# Patient Record
Sex: Male | Born: 1995 | Race: White | Hispanic: No | Marital: Single | Smoking: Never smoker
Health system: Southern US, Community
[De-identification: ages and names within clinical notes are randomized; demographics above are authoritative.]

## PROBLEM LIST (undated history)

## (undated) ENCOUNTER — Emergency Department (HOSPITAL_COMMUNITY): Admission: EM | Discharge status: Absent without leave | Payer: Commercial Managed Care - PPO

## (undated) DIAGNOSIS — F909 Attention-deficit hyperactivity disorder, unspecified type: Secondary | ICD-10-CM

## (undated) DIAGNOSIS — F419 Anxiety disorder, unspecified: Secondary | ICD-10-CM

## (undated) DIAGNOSIS — H539 Unspecified visual disturbance: Secondary | ICD-10-CM

## (undated) HISTORY — PX: NO PAST SURGERIES: SHX2092

---
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
## 2014-02-18 ENCOUNTER — Ambulatory Visit
Admission: RE | Admit: 2014-02-18 | Discharge: 2014-02-18 | Disposition: A | Discharge status: Home / Self Care | Payer: 59 | Source: Ambulatory Visit | Attending: Pediatrics | Admitting: Pediatrics

## 2014-02-18 ENCOUNTER — Other Ambulatory Visit: Payer: Self-pay | Admitting: Pediatrics

## 2014-02-18 DIAGNOSIS — R52 Pain, unspecified: Secondary | ICD-10-CM

## 2014-02-18 DIAGNOSIS — W19XXXA Unspecified fall, initial encounter: Secondary | ICD-10-CM

## 2014-12-02 ENCOUNTER — Emergency Department (HOSPITAL_COMMUNITY)
Admission: EM | Admit: 2014-12-02 | Discharge: 2014-12-02 | Disposition: A | Discharge status: Home / Self Care | Payer: Commercial Managed Care - PPO | Attending: Emergency Medicine | Admitting: Emergency Medicine

## 2014-12-02 ENCOUNTER — Encounter (HOSPITAL_COMMUNITY): Payer: Self-pay | Admitting: Emergency Medicine

## 2014-12-02 DIAGNOSIS — R000 Tachycardia, unspecified: Secondary | ICD-10-CM | POA: Insufficient documentation

## 2014-12-02 DIAGNOSIS — J028 Acute pharyngitis due to other specified organisms: Secondary | ICD-10-CM

## 2014-12-02 DIAGNOSIS — B9689 Other specified bacterial agents as the cause of diseases classified elsewhere: Secondary | ICD-10-CM

## 2014-12-02 DIAGNOSIS — J029 Acute pharyngitis, unspecified: Secondary | ICD-10-CM | POA: Diagnosis not present

## 2014-12-02 LAB — RAPID STREP SCREEN (MED CTR MEBANE ONLY): STREPTOCOCCUS, GROUP A SCREEN (DIRECT): NEGATIVE

## 2014-12-02 MED ORDER — SODIUM CHLORIDE 0.9 % IV BOLUS (SEPSIS)
1000.0000 mL | INTRAVENOUS | Status: AC
  Administered 2014-12-02: 1000 mL via INTRAVENOUS

## 2014-12-02 MED ORDER — OXYCODONE HCL 5 MG/5ML PO SOLN
5.0000 mg | Freq: Once | ORAL | Status: AC
  Administered 2014-12-02: 5 mg via ORAL
  Filled 2014-12-02: qty 5

## 2014-12-02 MED ORDER — PENICILLIN G BENZATHINE 1200000 UNIT/2ML IM SUSP
1.2000 10*6.[IU] | Freq: Once | INTRAMUSCULAR | Status: AC
  Administered 2014-12-02: 1.2 10*6.[IU] via INTRAMUSCULAR

## 2014-12-02 MED ORDER — OXYCODONE HCL 5 MG/5ML PO SOLN: 5.0000 mg | ORAL | Status: DC | PRN

## 2014-12-02 MED ORDER — DEXAMETHASONE SODIUM PHOSPHATE 10 MG/ML IJ SOLN
10.0000 mg | Freq: Once | INTRAMUSCULAR | Status: AC
  Administered 2014-12-02: 10 mg via INTRAVENOUS
  Filled 2014-12-02: qty 1

## 2014-12-02 MED ORDER — PENICILLIN G BENZATHINE 1200000 UNIT/2ML IM SUSP
2.4000 10*6.[IU] | Freq: Once | INTRAMUSCULAR | Status: DC
  Filled 2014-12-02: qty 4

## 2014-12-02 MED ORDER — SODIUM CHLORIDE 0.9 % IV BOLUS (SEPSIS)
1000.0000 mL | Freq: Once | INTRAVENOUS | Status: AC
  Administered 2014-12-02: 1000 mL via INTRAVENOUS

## 2014-12-02 NOTE — ED Notes (Addendum)
PT reports seeing white spots on his throat 2 days ago. Pain with swallowing; able to swallow. Tonsils 2+ bilaterally.

## 2014-12-02 NOTE — Discharge Instructions (Signed)
1. Medications: Roxicodone, usual home medications 2. Treatment: rest, drink plenty of fluids, tylenol or ibuprofen for fever control 3. Follow Up: Please followup with your primary doctor in 3 days for discussion of your diagnoses and further evaluation after today's visit; if you do not have a primary care doctor use the resource guide provided to find one; Please return to the ER for difficulty breathing or swallowing, feeling of throat closing. Please also follow-up with your nose and throat.   Pharyngitis Pharyngitis is redness, pain, and swelling (inflammation) of your pharynx.  CAUSES  Pharyngitis is usually caused by infection. Most of the time, these infections are from viruses (viral) and are part of a cold. However, sometimes pharyngitis is caused by bacteria (bacterial). Pharyngitis can also be caused by allergies. Viral pharyngitis may be spread from person to person by coughing, sneezing, and personal items or utensils (cups, forks, spoons, toothbrushes). Bacterial pharyngitis may be spread from person to person by more intimate contact, such as kissing.  SIGNS AND SYMPTOMS  Symptoms of pharyngitis include:   Sore throat.   Tiredness (fatigue).   Low-grade fever.   Headache.  Joint pain and muscle aches.  Skin rashes.  Swollen lymph nodes.  Plaque-like film on throat or tonsils (often seen with bacterial pharyngitis). DIAGNOSIS  Your health care provider will ask you questions about your illness and your symptoms. Your medical history, along with a physical exam, is often all that is needed to diagnose pharyngitis. Sometimes, a rapid strep test is done. Other lab tests may also be done, depending on the suspected cause.  TREATMENT  Viral pharyngitis will usually get better in 3-4 days without the use of medicine. Bacterial pharyngitis is treated with medicines that kill germs (antibiotics).  HOME CARE INSTRUCTIONS   Drink enough water and fluids to keep your urine  clear or pale yellow.   Only take over-the-counter or prescription medicines as directed by your health care provider:   If you are prescribed antibiotics, make sure you finish them even if you start to feel better.   Do not take aspirin.   Get lots of rest.   Gargle with 8 oz of salt water ( tsp of salt per 1 qt of water) as often as every 1-2 hours to soothe your throat.   Throat lozenges (if you are not at risk for choking) or sprays may be used to soothe your throat. SEEK MEDICAL CARE IF:   You have large, tender lumps in your neck.  You have a rash.  You cough up green, yellow-brown, or bloody spit. SEEK IMMEDIATE MEDICAL CARE IF:   Your neck becomes stiff.  You drool or are unable to swallow liquids.  You vomit or are unable to keep medicines or liquids down.  You have severe pain that does not go away with the use of recommended medicines.  You have trouble breathing (not caused by a stuffy nose). MAKE SURE YOU:   Understand these instructions.  Will watch your condition.  Will get help right away if you are not doing well or get worse. Document Released: 12/03/2005 Document Revised: 09/23/2013 Document Reviewed: 08/10/2013 [facility] Patient Information 2015 [place], [place]. This information is not intended to replace advice given to you by your health care provider. Make sure you discuss any questions you have with your health care provider.

## 2014-12-02 NOTE — ED Provider Notes (Signed)
CSN: [id number]     Arrival date & time 12/02/14  1845 History   First MD Initiated Contact with Patient 12/02/14 1913     Chief Complaint  Patient presents with  . Sore Throat     (Consider location/radiation/quality/duration/timing/severity/associated sxs/prior Treatment) Patient is a 18 y.o. male presenting with pharyngitis. The history is provided by the patient and medical records. No language interpreter was used.  Sore Throat Associated symptoms include coughing (minimal), fatigue and a sore throat. Pertinent negatives include no abdominal pain, chest pain, diaphoresis, fever, headaches, nausea, rash or vomiting.   John Barker is a 18 y.o. male  with no major medical problems presents to the Emergency Department complaining of gradual, persistent, progressively worsening sore throat onset 2 days ago. Associated symptoms include pain with swallowing, minimal cough and general fatigue.  Pt denies sick contacts and reports a hx of strep in the last few years.  Pt reports he is drinking some fluids, but has had a decreased PO intake due to pain in his throat.  Pt reports no difficulty breathing or swallowing.  He reports 1 episode of emesis today.  Nothing makes it better and nothing makes it worse.  Pt denies fever, chills, headache, neck pain, neck stiffness, chest pain, SOB, abd pain, weakness, dizziness, syncope.      History reviewed. No pertinent past medical history. History reviewed. No pertinent past surgical history. History reviewed. No pertinent family history. History  Substance Use Topics  . Smoking status: Never Smoker   . Smokeless tobacco: Not on file  . Alcohol Use: No    Review of Systems  Constitutional: Positive for fatigue. Negative for fever, diaphoresis, appetite change and unexpected weight change.  HENT: Positive for sore throat and trouble swallowing (pain). Negative for drooling and mouth sores.   Eyes: Negative for visual disturbance.  Respiratory:  Positive for cough (minimal). Negative for chest tightness, shortness of breath and wheezing.   Cardiovascular: Negative for chest pain.  Gastrointestinal: Negative for nausea, vomiting, abdominal pain, diarrhea and constipation.  Endocrine: Negative for polydipsia, polyphagia and polyuria.  Genitourinary: Negative for dysuria, urgency, frequency and hematuria.  Musculoskeletal: Negative for back pain and neck stiffness.  Skin: Negative for rash.  Allergic/Immunologic: Negative for immunocompromised state.  Neurological: Negative for syncope, light-headedness and headaches.  Hematological: Does not bruise/bleed easily.  Psychiatric/Behavioral: Negative for sleep disturbance. The patient is not nervous/anxious.       Allergies  Review of patient's allergies indicates no known allergies.  Home Medications   Prior to Admission medications   Medication Sig Start Date End Date Taking? Authorizing Provider  oxyCODONE (ROXICODONE) 5 MG/5ML solution Take 5 mLs (5 mg total) by mouth every 4 (four) hours as needed for severe pain. 12/02/14   Chadd Tollison, PA-C   BP 124/56 mmHg  Pulse 104  Temp(Src) 98.7 F (37.1 C) (Oral)  Resp 20  SpO2 97% Physical Exam  Constitutional: He appears well-developed and well-nourished. No distress.  HENT:  Head: Normocephalic and atraumatic.  Right Ear: Tympanic membrane, external ear and ear canal normal.  Left Ear: Tympanic membrane, external ear and ear canal normal.  Nose: Nose normal. No mucosal edema or rhinorrhea.  Mouth/Throat: Uvula is midline. Mucous membranes are dry. No trismus in the jaw. No uvula swelling. Oropharyngeal exudate, posterior oropharyngeal edema and posterior oropharyngeal erythema present. No tonsillar abscesses.  Posterior oropharynx with erythema, edema and exudate on the tonsils Dry mucous membranes  Eyes: Conjunctivae are normal.  Neck: Normal range of  motion, full passive range of motion without pain and phonation  normal. No tracheal tenderness, no spinous process tenderness and no muscular tenderness present. No rigidity. No erythema and normal range of motion present. No Brudzinski's sign and no Kernig's sign noted.  Range of motion without pain no No midline or paraspinal tenderness Normal phonation No stridor Handling secretions without difficulty No nuchal rigidity or meningeal signs  Cardiovascular: Regular rhythm, normal heart sounds and intact distal pulses.   No murmur heard. Pulses:      Radial pulses are 2+ on the right side, and 2+ on the left side.  tachycardia  Pulmonary/Chest: Effort normal and breath sounds normal. No stridor. No respiratory distress. He has no decreased breath sounds. He has no wheezes.  Equal chest expansion, clear and equal breath sounds without focal wheezes, rhonchi or rales  Abdominal: Soft. Bowel sounds are normal. He exhibits no distension. There is no tenderness. There is no rebound and no guarding.  Musculoskeletal: Normal range of motion.  Lymphadenopathy:       Head (right side): Submandibular and tonsillar adenopathy present. No submental, no preauricular, no posterior auricular and no occipital adenopathy present.       Head (left side): Submandibular and tonsillar adenopathy present. No submental, no preauricular, no posterior auricular and no occipital adenopathy present.    Cervical adenopathy:  superficial        Right cervical: No deep cervical and no posterior cervical adenopathy present.      Left cervical: No deep cervical and no posterior cervical adenopathy present.  Neurological: He is alert. He exhibits normal muscle tone. Coordination normal.  Alert and oriented Moves all extremities without ataxia  Skin: Skin is warm and dry. He is not diaphoretic. No erythema.  Psychiatric: He has a normal mood and affect.  Nursing note and vitals reviewed.   ED Course  Procedures (including critical care time) Labs Review Labs Reviewed  RAPID  STREP SCREEN  CULTURE, GROUP A STREP    Imaging Review No results found.   EKG Interpretation None      MDM   Final diagnoses:  Bacterial pharyngitis  Sore throat   John Barker presents with Sore throat, exudate, swollen lymph nodes.  Concern for bacterial pharyngitis.  Will treat like strep and give fluid bolus as pt is tachycardic.     9:49 PM Pt with significant improved tachycardia.  Pt afebrile with tonsillar exudate, cervical lymphadenopathy, & dysphagia; diagnosis of bacterial pharyngitis. Treated in the ED with steroids, Pain medication and PCN IM.  Pt appears mildly dehydrated, discussed importance of water rehydration and fluid bolus given here. Presentation non concerning for PTA or infxn spread to soft tissue. No trismus or uvula deviation. Specific return precautions discussed. Pt able to drink water in ED without difficulty with intact air way. Recommended PCP follow up.   I have personally reviewed patient's vitals, nursing note and any pertinent labs or imaging.  I performed an focused physical exam; undressed when appropriate .    It has been determined that no acute conditions requiring further emergency intervention are present at this time. The patient/guardian have been advised of the diagnosis and plan. I reviewed any labs and imaging including any potential incidental findings. We have discussed signs and symptoms that warrant return to the ED and they are listed in the discharge instructions.    Vital signs are stable at discharge.   BP 124/56 mmHg  Pulse 104  Temp(Src) 98.7 F (37.1 C) (Oral)  Resp 20  SpO2 97%       Dierdre Forth, PA-C 12/02/14 2210  Ethelda Chick, MD 12/02/14 2211

## 2014-12-04 LAB — CULTURE, GROUP A STREP

## 2014-12-10 ENCOUNTER — Emergency Department (HOSPITAL_COMMUNITY)
Admission: EM | Admit: 2014-12-10 | Discharge: 2014-12-10 | Disposition: A | Discharge status: Home / Self Care | Payer: Commercial Managed Care - PPO | Attending: Emergency Medicine | Admitting: Emergency Medicine

## 2014-12-10 ENCOUNTER — Encounter (HOSPITAL_COMMUNITY): Payer: Self-pay | Admitting: Emergency Medicine

## 2014-12-10 DIAGNOSIS — J029 Acute pharyngitis, unspecified: Secondary | ICD-10-CM | POA: Insufficient documentation

## 2014-12-10 MED ORDER — OXYCODONE-ACETAMINOPHEN 5-325 MG PO TABS: 2.0000 | ORAL_TABLET | ORAL | Status: DC | PRN

## 2014-12-10 MED ORDER — CLINDAMYCIN HCL 300 MG PO CAPS: 300.0000 mg | ORAL_CAPSULE | Freq: Four times a day (QID) | ORAL | Status: DC

## 2014-12-10 MED ORDER — DEXAMETHASONE SODIUM PHOSPHATE 10 MG/ML IJ SOLN
10.0000 mg | Freq: Once | INTRAMUSCULAR | Status: AC
  Administered 2014-12-10: 10 mg via INTRAMUSCULAR
  Filled 2014-12-10: qty 1

## 2014-12-10 MED ORDER — HYDROCODONE-ACETAMINOPHEN 7.5-325 MG/15ML PO SOLN
10.0000 mL | Freq: Once | ORAL | Status: AC
  Administered 2014-12-10: 10 mL via ORAL
  Filled 2014-12-10: qty 15

## 2014-12-10 NOTE — ED Provider Notes (Signed)
CSN: [id number]     Arrival date & time 12/10/14  1322 History   First MD Initiated Contact with Patient 12/10/14 1328     Chief Complaint  Patient presents with  . Sore Throat     (Consider location/radiation/quality/duration/timing/severity/associated sxs/prior Treatment) HPI John Barker is a 18 y.o. male who is here for evaluation of sore throat. Patient states he was seen on the 17th for the same problem, was treated with a shot of penicillin and discharged home. Patient states he felt well until approximately 2 days ago when his sore throat began to bother him again. He did not fill his prescription for pain medicine. Mother is with him and reports she is concerned because he has had decreased PO. Patient reports he has had 2 glasses of water today. He denies fevers at home, changes in his voice.  History reviewed. No pertinent past medical history. History reviewed. No pertinent past surgical history. History reviewed. No pertinent family history. History  Substance Use Topics  . Smoking status: Never Smoker   . Smokeless tobacco: Not on file  . Alcohol Use: No    Review of Systems  Constitutional: Negative for fever.  HENT: Positive for sore throat. Negative for drooling, postnasal drip, rhinorrhea and voice change.   Eyes: Negative for visual disturbance.  Respiratory: Negative for shortness of breath.   Cardiovascular: Negative for chest pain.  Gastrointestinal: Negative for abdominal pain.  Endocrine: Negative for polyuria.  Genitourinary: Negative for dysuria.  Skin: Negative for rash.  Neurological: Negative for headaches.      Allergies  Review of patient's allergies indicates no known allergies.  Home Medications   Prior to Admission medications   Medication Sig Start Date End Date Taking? Authorizing Provider  acetaminophen (TYLENOL) 500 MG tablet Take 1,500 mg by mouth every 6 (six) hours as needed for mild pain or moderate pain.   Yes Historical Provider,  MD  clindamycin (CLEOCIN) 300 MG capsule Take 1 capsule (300 mg total) by mouth 4 (four) times daily. X 7 days 12/10/14   Earle GellBenjamin W Terianne Thaker, PA-C  oxyCODONE (ROXICODONE) 5 MG/5ML solution Take 5 mLs (5 mg total) by mouth every 4 (four) hours as needed for severe pain. 12/02/14   Hannah Muthersbaugh, PA-C  oxyCODONE-acetaminophen (PERCOCET/ROXICET) 5-325 MG per tablet Take 2 tablets by mouth every 4 (four) hours as needed for moderate pain or severe pain. 12/10/14   Earle GellBenjamin W Jaedan Huttner, PA-C   BP 123/76 mmHg  Pulse 72  Temp(Src) 98.5 F (36.9 C) (Oral)  Resp 15  SpO2 98% Physical Exam  Constitutional: He is oriented to person, place, and time. He appears well-developed and well-nourished.  HENT:  Head: Normocephalic and atraumatic.  Mouth/Throat: Oropharynx is clear and moist.  Patient with mildly erythematous oropharynx. Tonsils are 3+ bilaterally, scant exudate. No evidence of abscess, no glossal elevation, no trismus. No submandibular pain. Mild cervical adenopathy  Eyes: Conjunctivae are normal. Pupils are equal, round, and reactive to light. Right eye exhibits no discharge. Left eye exhibits no discharge. No scleral icterus.  Neck: Normal range of motion. Neck supple. No JVD present. No tracheal deviation present.  Cardiovascular: Normal rate, regular rhythm and normal heart sounds.   Pulmonary/Chest: Effort normal and breath sounds normal. No respiratory distress. He has no wheezes. He has no rales.  Abdominal: Soft. There is no tenderness.  Musculoskeletal: He exhibits no tenderness.  Neurological: He is alert and oriented to person, place, and time.  Cranial Nerves II-XII grossly intact  Skin:  Skin is warm and dry. No rash noted.  Good skin turgor  Psychiatric: He has a normal mood and affect.  Nursing note and vitals reviewed.   ED Course  Procedures (including critical care time) Labs Review Labs Reviewed - No data to display  Imaging Review No results found.   EKG  Interpretation None     Meds given in ED:  Medications  HYDROcodone-acetaminophen (HYCET) 7.5-325 mg/15 ml solution 10 mL (10 mLs Oral Given 12/10/14 1431)  dexamethasone (DECADRON) injection 10 mg (10 mg Intramuscular Given 12/10/14 1521)    Discharge Medication List as of 12/10/2014  3:14 PM    START taking these medications   Details  clindamycin (CLEOCIN) 300 MG capsule Take 1 capsule (300 mg total) by mouth 4 (four) times daily. X 7 days, Starting 12/10/2014, Until Discontinued, Print    oxyCODONE-acetaminophen (PERCOCET/ROXICET) 5-325 MG per tablet Take 2 tablets by mouth every 4 (four) hours as needed for moderate pain or severe pain., Starting 12/10/2014, Until Discontinued, Print       Filed Vitals:   12/10/14 1331 12/10/14 1523  BP: 123/77 123/76  Pulse: 114 72  Temp: 98.9 F (37.2 C) 98.5 F (36.9 C)  TempSrc: Oral Oral  Resp: 20 15  SpO2: 97% 98%    MDM  John Barker is a 18 y.o. male who presents for evaluation of sore throat. Patient was seen on the 17th given IM penicillin and steroids and reports he felt much better until the last couple days when his sore throat returned.  Vitals stable - WNL -afebrile Pt resting comfortably in ED. steroids given in ED. patient is tolerating PO in the ED. PE--not concerning further acute or emergent pathologies. There is no "hot potato voice", no unilateral tonsillar swelling, no glossal elevation, no trismus. Low concern for peritonsillar abscess, Ludwig's or vincents angina or other acute oropharyngeal emergency. Patient with moist mucous membranes and good skin turgor, no evidence of dehydration.  Will DC with pain medicine and clindamycin. Patient prefers oral tablets over elixir. Encouraged supportive care with salt water gargles. Reports he will be able to follow up with primary care after the holidays. Discussed f/u with PCP and return precautions, pt very amenable to plan.   Final diagnoses:  Pharyngitis         Sharlene MottsBenjamin W Zanetta Dehaan, PA-C 12/10/14 1614  Toy BakerAnthony T Allen, MD 12/12/14 [phone]

## 2014-12-10 NOTE — ED Notes (Signed)
Pt seen here 12/17 for swollen tonsils/sore throat. Was given steroids and IM PCN. Pt reports sore throat with inability to swallow. I triaged pt during last visit and tonsils appear larger than then. Tonsils 3+

## 2014-12-10 NOTE — Discharge Instructions (Signed)
It is important if you take all of your antibiotics as prescribed. May take your pain medicine as needed for severe pain. It is important to follow up with your primary care provider for further evaluation and management of your symptoms. Return to ED for worsening symptoms, fevers, changes in voice, decreased fluid intake, increased swelling to your throat cheeks

## 2014-12-14 ENCOUNTER — Encounter (HOSPITAL_COMMUNITY): Payer: Self-pay | Admitting: Emergency Medicine

## 2014-12-14 ENCOUNTER — Emergency Department (HOSPITAL_COMMUNITY)
Admission: EM | Admit: 2014-12-14 | Discharge: 2014-12-14 | Disposition: A | Discharge status: Home / Self Care | Payer: Commercial Managed Care - PPO | Attending: Emergency Medicine | Admitting: Emergency Medicine

## 2014-12-14 DIAGNOSIS — R220 Localized swelling, mass and lump, head: Secondary | ICD-10-CM | POA: Diagnosis present

## 2014-12-14 DIAGNOSIS — J36 Peritonsillar abscess: Secondary | ICD-10-CM | POA: Insufficient documentation

## 2014-12-14 MED ORDER — OXYCODONE-ACETAMINOPHEN 5-325 MG PO TABS: 1.0000 | ORAL_TABLET | ORAL | Status: DC | PRN

## 2014-12-14 MED ORDER — OXYCODONE-ACETAMINOPHEN 5-325 MG PO TABS
1.0000 | ORAL_TABLET | Freq: Once | ORAL | Status: AC
  Administered 2014-12-14: 1 via ORAL
  Filled 2014-12-14: qty 1

## 2014-12-14 NOTE — ED Provider Notes (Signed)
Complains of left-sided sore throat for the past 2 weeks. It is worse with swallowing. Patient has been on clindamycin since 12/10/2014 with slight improvement. Pain is moderate at present. He declined further pain medicine on exam patient is alert nontoxic young secretions well. There is a fullness to the left peritonsillar area. Uvula is midline. He is handling secretions well . suspect peritonsillar abscess  Doug SouSam Callahan Peddie, MD 12/14/14 [phone]

## 2014-12-14 NOTE — Discharge Instructions (Signed)
Peritonsillar Abscess °A peritonsillar abscess is a collection of pus located in the back of the throat behind the tonsils. It usually occurs when a streptococcal infection of the throat or tonsils spreads into the space around the tonsils. They are almost always caused by the streptococcal germ (bacteria). The treatment of a peritonsillar abscess is most often drainage accomplished by putting a needle into the abscess or cutting (incising) and draining the abscess. This is most often followed with a course of antibiotics. °HOME CARE INSTRUCTIONS °· If your abscess was drained by your caregiver today, rinse your throat (gargle) with warm salt water four times per day or as needed for comfort. Do not swallow this mixture. Mix 1 teaspoon of salt in 8 ounces of warm water for gargling. °· Rest in bed as needed. Resume activities as able. °· Apply cold to your neck for pain relief. Fill a plastic bag with ice and wrap it in a towel. Hold the ice on your neck for 20 minutes 4 times per day. °· Eat a soft or liquid diet as tolerated while your throat remains sore. Popsicles and ice cream may be good early choices. Drinking plenty of cold fluids will probably be soothing and help take swelling down in between the warm gargles. °· Only take over-the-counter or prescription medicines for pain, discomfort, or fever as directed by your caregiver. Do not use aspirin unless directed by your physician. Aspirin slows down the clotting process. It can also cause bleeding from the drainage area if this was needled or incised today. °· If antibiotics were prescribed, take them as directed for the full course of the prescription. Even if you feel you are well, you need to take them. °SEEK MEDICAL CARE IF:  °· You have increased pain, swelling, redness, or drainage in your throat. °· You develop signs of infection such as dizziness, headache, lethargy, or generalized feelings of illness. °· You have difficulty breathing, swallowing or  eating. °· You show signs of becoming dehydrated (lightheadedness when standing, decreased urine output, a fast heart rate, or dry mouth and mucous membranes). °SEEK IMMEDIATE MEDICAL CARE IF:  °· You have a fever. °· You are coughing up or vomiting blood. °· You develop more severe throat pain uncontrolled with medicines or you start to drool. °· You develop difficulty breathing, talking, or find it easier to breathe while leaning forward. °Document Released: 12/03/2005 Document Revised: 02/25/2012 Document Reviewed: 07/16/2008 °[employer]® Patient Information ©2015 [employer]. This information is not intended to replace advice given to you by your health care provider. Make sure you discuss any questions you have with your health care provider. ° °

## 2014-12-14 NOTE — ED Provider Notes (Signed)
CSN: [id number]     Arrival date & time 12/14/14  0117 History   First MD Initiated Contact with Patient 12/14/14 0359     Chief Complaint  Patient presents with  . Oral Swelling     (Consider location/radiation/quality/duration/timing/severity/associated sxs/prior Treatment) Patient is a 18 y.o. male presenting with pharyngitis. The history is provided by the patient. No language interpreter was used.  Sore Throat Associated symptoms include a fever and a sore throat. Pertinent negatives include no abdominal pain, chills, congestion, coughing, rash or vomiting. Associated symptoms comments: The patient returns to the ED for continued symptoms of sore throat and difficulty swallowing. He was seen on 12/17 and 12/25 and has been taking Clindamycin with little improvement. He reports fever started yesterday prompting a return trip to the ED.Marland Kitchen.    History reviewed. No pertinent past medical history. History reviewed. No pertinent past surgical history. History reviewed. No pertinent family history. History  Substance Use Topics  . Smoking status: Never Smoker   . Smokeless tobacco: Not on file  . Alcohol Use: No    Review of Systems  Constitutional: Positive for fever. Negative for chills.  HENT: Positive for sore throat and trouble swallowing. Negative for congestion.   Respiratory: Negative.  Negative for cough.   Cardiovascular: Negative.   Gastrointestinal: Negative.  Negative for vomiting and abdominal pain.  Musculoskeletal: Negative.   Skin: Negative.  Negative for rash.  Neurological: Negative.       Allergies  Review of patient's allergies indicates no known allergies.  Home Medications   Prior to Admission medications   Medication Sig Start Date End Date Taking? Authorizing Provider  acetaminophen (TYLENOL) 500 MG tablet Take 1,500 mg by mouth every 6 (six) hours as needed for mild pain or moderate pain.    Historical Provider, MD  clindamycin (CLEOCIN) 300 MG  capsule Take 1 capsule (300 mg total) by mouth 4 (four) times daily. X 7 days 12/10/14   John GellBenjamin W Cartner, PA-C  oxyCODONE (ROXICODONE) 5 MG/5ML solution Take 5 mLs (5 mg total) by mouth every 4 (four) hours as needed for severe pain. 12/02/14   John Muthersbaugh, PA-C  oxyCODONE-acetaminophen (PERCOCET/ROXICET) 5-325 MG per tablet Take 2 tablets by mouth every 4 (four) hours as needed for moderate pain or severe pain. 12/10/14   John GellBenjamin W Cartner, PA-C   BP 125/84 mmHg  Pulse 105  Temp(Src) 98.7 F (37.1 C) (Oral)  Resp 18  SpO2 97% Physical Exam  Constitutional: He is oriented to person, place, and time. He appears well-developed and well-nourished.  HENT:  Mouth/Throat: Oropharyngeal exudate present.  Significant left tonsillar and peritonsillar swelling with exudate. Uvula midline. Moderate trismus. He is managing his own secretions.   Neck: Normal range of motion.  Cardiovascular: Normal rate.   Pulmonary/Chest: Effort normal. No stridor.  Musculoskeletal: Normal range of motion.  Lymphadenopathy:    He has cervical adenopathy.  Neurological: He is alert and oriented to person, place, and time.  Skin: Skin is warm and dry.  Psychiatric: He has a normal mood and affect.    ED Course  Procedures (including critical care time) Labs Review Labs Reviewed - No data to display  Imaging Review No results found.   EKG Interpretation None      MDM   Final diagnoses:  None    1. Peritonsillar abscess, left   The patient is evaluated by Dr. Ethelda ChickJacubowitz and outpatient management is felt to be appropriate. Discussed with Dr. Annalee GentaShoemaker who states he would  see him in the office today for further management.    Arnoldo HookerShari A Leeon Makar, PA-C 12/14/14 [id number]  Doug SouSam Jacubowitz, MD 12/14/14 [phone]

## 2014-12-14 NOTE — ED Notes (Signed)
Pt arrived to the ED with a complaint of oral swelling.  Pt states that he sore throat and swelling began a week ago.  Pt seen twice for same.  Pt has not been able to get a PCP and the pain has increased.  Pt states his tongue is swelling.

## 2014-12-14 NOTE — ED Notes (Signed)
Bed: WA05 Expected date:  Expected time:  Means of arrival:  Comments: 

## 2014-12-14 NOTE — ED Notes (Signed)
Patient c/o tongue swelling, throat pain x1 month. Patient seen twice for same, given antibiotics and pain medications without relief. C/o difficulty swallowing, difficulty speaking and opening mouth. Patient A&O x4. Redness noted to back of throat, coating noted tongue. Rates pain 6/10.

## 2015-02-25 ENCOUNTER — Ambulatory Visit (INDEPENDENT_AMBULATORY_CARE_PROVIDER_SITE_OTHER): Payer: Commercial Managed Care - PPO | Admitting: Emergency Medicine

## 2015-02-25 ENCOUNTER — Ambulatory Visit (INDEPENDENT_AMBULATORY_CARE_PROVIDER_SITE_OTHER): Payer: Commercial Managed Care - PPO

## 2015-02-25 ENCOUNTER — Ambulatory Visit (HOSPITAL_COMMUNITY)
Admission: RE | Admit: 2015-02-25 | Discharge: 2015-02-25 | Disposition: A | Discharge status: Home / Self Care | Payer: Commercial Managed Care - PPO | Source: Ambulatory Visit | Attending: Emergency Medicine | Admitting: Emergency Medicine

## 2015-02-25 VITALS — BP 110/80 | HR 99 | Temp 98.3°F | Resp 17 | Ht 70.0 in | Wt 233.0 lb

## 2015-02-25 DIAGNOSIS — S139XXA Sprain of joints and ligaments of unspecified parts of neck, initial encounter: Secondary | ICD-10-CM

## 2015-02-25 DIAGNOSIS — R51 Headache: Secondary | ICD-10-CM | POA: Insufficient documentation

## 2015-02-25 DIAGNOSIS — S0990XA Unspecified injury of head, initial encounter: Secondary | ICD-10-CM

## 2015-02-25 DIAGNOSIS — W1839XA Other fall on same level, initial encounter: Secondary | ICD-10-CM | POA: Diagnosis not present

## 2015-02-25 MED ORDER — NAPROXEN SODIUM 550 MG PO TABS: 550.0000 mg | ORAL_TABLET | Freq: Two times a day (BID) | ORAL | Status: AC

## 2015-02-25 MED ORDER — CYCLOBENZAPRINE HCL 10 MG PO TABS: 10.0000 mg | ORAL_TABLET | Freq: Three times a day (TID) | ORAL | Status: DC | PRN

## 2015-02-25 NOTE — Progress Notes (Signed)
[facility] [address], [place] [id number] [phone]  Date:  02/25/2015   Name:  John Barker   DOB:  11-Jul-1996   MRN:  [id number]  PCP:  No PCP Per Patient    Chief Complaint: Concussion   History of Present Illness:  John Barker is a 19 y.o. very pleasant male patient who presents with the following:  Riding a skateboard that got away from him and he jumped off.   Landed hard and struck his head No LOC.  Pain in left temporo-parietal area Some dizziness and intermittent blurred vision.  No nausea or vomiting No radicular symptoms.  No weakness.  Some neck pain Multiple abrasions. No difficulty with gait balance or coordination. No improvement with over the counter medications or other home remedies.  Denies other complaint or health concern today.   Patient Active Problem List   Diagnosis Date Noted  . Depression, major, single episode, moderate 01/10/2012  . Anxiety disorder 01/10/2012  . Attention-deficit hyperactivity disorder, combined type 01/10/2012    Past Medical History  Diagnosis Date  . ADHD (attention deficit hyperactivity disorder)   . Anxiety   . Vision abnormalities     Wears glasses  . Asthma     Past Surgical History  Procedure Laterality Date  . No past surgeries      History  Substance Use Topics  . Smoking status: Never Smoker   . Smokeless tobacco: Not on file  . Alcohol Use: No    Family History  Problem Relation Age of Onset  . Bipolar disorder Paternal Grandmother   . Depression Maternal Grandmother   . Heart disease Paternal Grandfather   . Hypertension Paternal Grandfather     No Known Allergies  Medication list has been reviewed and updated.  Current Outpatient Prescriptions on File Prior to Visit  Medication Sig Dispense Refill  . Melatonin 1 MG CAPS Take 3 mg by mouth at bedtime as needed. For sleep...usually takes 1 tablet, but if sleep is needed quickly, will take 2 tablets.    Marland Kitchen.  acetaminophen (TYLENOL) 500 MG tablet Take 1,500 mg by mouth every 6 (six) hours as needed for mild pain or moderate pain.    Marland Kitchen. amoxicillin (AMOXIL) 875 MG tablet Take 1 tablet (875 mg total) by mouth 2 (two) times daily. (Patient not taking: Reported on 02/25/2015) 20 tablet 0  . clindamycin (CLEOCIN) 300 MG capsule Take 1 capsule (300 mg total) by mouth 4 (four) times daily. X 7 days (Patient not taking: Reported on 02/25/2015) 28 capsule 0  . naproxen (NAPROSYN) 500 MG tablet Take 500 mg by mouth 2 (two) times daily as needed.    Marland Kitchen. oxyCODONE (ROXICODONE) 5 MG/5ML solution Take 5 mLs (5 mg total) by mouth every 4 (four) hours as needed for severe pain. (Patient not taking: Reported on 12/14/2014) 100 mL 0  . oxyCODONE-acetaminophen (PERCOCET/ROXICET) 5-325 MG per tablet Take 1-2 tablets by mouth every 4 (four) hours as needed for severe pain. (Patient not taking: Reported on 02/25/2015) 15 tablet 0   No current facility-administered medications on file prior to visit.    Review of Systems:  As per HPI, otherwise negative.    Physical Examination: Filed Vitals:   02/25/15 1546  BP: 110/80  Pulse: 99  Temp: 98.3 F (36.8 C)  Resp: 17   Filed Vitals:   02/25/15 1546  Height: 5\' 10"  (1.778 m)  Weight: 233 lb (105.688 kg)   Body mass index is  33.43 kg/(m^2). Ideal Body Weight: Weight in (lb) to have BMI = 25: 173.9  GEN: obese, NAD, Non-toxic, A & O x 3 HEENT: Atraumatic, Normocephalic. Neck supple. No masses, No LAD. Ears and Nose: No external deformity. CV: RRR, No M/G/R. No JVD. No thrill. No extra heart sounds. PULM: CTA B, no wheezes, crackles, rhonchi. No retractions. No resp. distress. No accessory muscle use. ABD: S, NT, ND, +BS. No rebound. No HSM. EXTR: No c/c/e NEURO Normal gait.  PSYCH: Normally interactive. Conversant. Not depressed or anxious appearing.  Calm demeanor.    Assessment and Plan: Concussion with NO LOC Cervical strain CT Anaprox Flexeril F/u one  week   Signed,  Phillips Odor, MD   [facility] reading (PRIMARY) by  Dr. Dareen Piano.  Negative .

## 2015-02-25 NOTE — Patient Instructions (Signed)
Cervical Sprain °A cervical sprain is an injury in the neck in which the strong, fibrous tissues (ligaments) that connect your neck bones stretch or tear. Cervical sprains can range from mild to severe. Severe cervical sprains can cause the neck vertebrae to be unstable. This can lead to damage of the spinal cord and can result in serious nervous system problems. The amount of time it takes for a cervical sprain to get better depends on the cause and extent of the injury. Most cervical sprains heal in 1 to 3 weeks. °CAUSES  °Severe cervical sprains may be caused by:  °· Contact sport injuries (such as from football, rugby, wrestling, hockey, auto racing, gymnastics, diving, martial arts, or boxing).   °· Motor vehicle collisions.   °· Whiplash injuries. This is an injury from a sudden forward and backward whipping movement of the head and neck.  °· Falls.   °Mild cervical sprains may be caused by:  °· Being in an awkward position, such as while cradling a telephone between your ear and shoulder.   °· Sitting in a chair that does not offer proper support.   °· Working at a poorly designed computer station.   °· Looking up or down for long periods of time.   °SYMPTOMS  °· Pain, soreness, stiffness, or a burning sensation in the front, back, or sides of the neck. This discomfort may develop immediately after the injury or slowly, 24 hours or more after the injury.   °· Pain or tenderness directly in the middle of the back of the neck.   °· Shoulder or upper back pain.   °· Limited ability to move the neck.   °· Headache.   °· Dizziness.   °· Weakness, numbness, or tingling in the hands or arms.   °· Muscle spasms.   °· Difficulty swallowing or chewing.   °· Tenderness and swelling of the neck.   °DIAGNOSIS  °Most of the time your health care provider can diagnose a cervical sprain by taking your history and doing a physical exam. Your health care provider will ask about previous neck injuries and any known neck  problems, such as arthritis in the neck. X-rays may be taken to find out if there are any other problems, such as with the bones of the neck. Other tests, such as a CT scan or MRI, may also be needed.  °TREATMENT  °Treatment depends on the severity of the cervical sprain. Mild sprains can be treated with rest, keeping the neck in place (immobilization), and pain medicines. Severe cervical sprains are immediately immobilized. Further treatment is done to help with pain, muscle spasms, and other symptoms and may include: °· Medicines, such as pain relievers, numbing medicines, or muscle relaxants.   °· Physical therapy. This may involve stretching exercises, strengthening exercises, and posture training. Exercises and improved posture can help stabilize the neck, strengthen muscles, and help stop symptoms from returning.   °HOME CARE INSTRUCTIONS  °· Put ice on the injured area.   °¨ Put ice in a plastic bag.   °¨ Place a towel between your skin and the bag.   °¨ Leave the ice on for 15-20 minutes, 3-4 times a day.   °· If your injury was severe, you may have been given a cervical collar to wear. A cervical collar is a two-piece collar designed to keep your neck from moving while it heals. °¨ Do not remove the collar unless instructed by your health care provider. °¨ If you have long hair, keep it outside of the collar. °¨ Ask your health care provider before making any adjustments to your collar. Minor   adjustments may be required over time to improve comfort and reduce pressure on your chin or on the back of your head. °¨ If you are allowed to remove the collar for cleaning or bathing, follow your health care provider's instructions on how to do so safely. °¨ Keep your collar clean by wiping it with mild soap and water and drying it completely. If the collar you have been given includes removable pads, remove them every 1-2 days and hand wash them with soap and water. Allow them to air dry. They should be completely  dry before you wear them in the collar. °¨ If you are allowed to remove the collar for cleaning and bathing, wash and dry the skin of your neck. Check your skin for irritation or sores. If you see any, tell your health care provider. °¨ Do not drive while wearing the collar.   °· Only take over-the-counter or prescription medicines for pain, discomfort, or fever as directed by your health care provider.   °· Keep all follow-up appointments as directed by your health care provider.   °· Keep all physical therapy appointments as directed by your health care provider.   °· Make any needed adjustments to your workstation to promote good posture.   °· Avoid positions and activities that make your symptoms worse.   °· Warm up and stretch before being active to help prevent problems.   °SEEK MEDICAL CARE IF:  °· Your pain is not controlled with medicine.   °· You are unable to decrease your pain medicine over time as planned.   °· Your activity level is not improving as expected.   °SEEK IMMEDIATE MEDICAL CARE IF:  °· You develop any bleeding. °· You develop stomach upset. °· You have signs of an allergic reaction to your medicine.   °· Your symptoms get worse.   °· You develop new, unexplained symptoms.   °· You have numbness, tingling, weakness, or paralysis in any part of your body.   °MAKE SURE YOU:  °· Understand these instructions. °· Will watch your condition. °· Will get help right away if you are not doing well or get worse. °Document Released: 09/30/2007 Document Revised: 12/08/2013 Document Reviewed: 06/10/2013 °[employer]® Patient Information ©2015 [employer]. This information is not intended to replace advice given to you by your health care provider. Make sure you discuss any questions you have with your health care provider. ° °Concussion °A concussion, or closed-head injury, is a brain injury caused by a direct blow to the head or by a quick and sudden movement (jolt) of the head or neck. Concussions are  usually not life-threatening. Even so, the effects of a concussion can be serious. If you have had a concussion before, you are more likely to experience concussion-like symptoms after a direct blow to the head.  °CAUSES °· Direct blow to the head, such as from running into another player during a soccer game, being hit in a fight, or hitting your head on a hard surface. °· A jolt of the head or neck that causes the brain to move back and forth inside the skull, such as in a car crash. °SIGNS AND SYMPTOMS °The signs of a concussion can be hard to notice. Early on, they may be missed by you, family members, and health care providers. You may look fine but act or feel differently. °Symptoms are usually temporary, but they may last for days, weeks, or even longer. Some symptoms may appear right away while others may not show up for hours or days. Every head injury is different. Symptoms   include: °· Mild to moderate headaches that will not go away. °· A feeling of pressure inside your head. °· Having more trouble than usual: °¨ Learning or remembering things you have heard. °¨ Answering questions. °¨ Paying attention or concentrating. °¨ Organizing daily tasks. °¨ Making decisions and solving problems. °· Slowness in thinking, acting or reacting, speaking, or reading. °· Getting lost or being easily confused. °· Feeling tired all the time or lacking energy (fatigued). °· Feeling drowsy. °· Sleep disturbances. °¨ Sleeping more than usual. °¨ Sleeping less than usual. °¨ Trouble falling asleep. °¨ Trouble sleeping (insomnia). °· Loss of balance or feeling lightheaded or dizzy. °· Nausea or vomiting. °· Numbness or tingling. °· Increased sensitivity to: °¨ Sounds. °¨ Lights. °¨ Distractions. °· Vision problems or eyes that tire easily. °· Diminished sense of taste or smell. °· Ringing in the ears. °· Mood changes such as feeling sad or anxious. °· Becoming easily irritated or angry for little or no reason. °· Lack of  motivation. °· Seeing or hearing things other people do not see or hear (hallucinations). °DIAGNOSIS °Your health care provider can usually diagnose a concussion based on a description of your injury and symptoms. He or she will ask whether you passed out (lost consciousness) and whether you are having trouble remembering events that happened right before and during your injury. °Your evaluation might include: °· A brain scan to look for signs of injury to the brain. Even if the test shows no injury, you may still have a concussion. °· Blood tests to be sure other problems are not present. °TREATMENT °· Concussions are usually treated in an emergency department, in urgent care, or at a clinic. You may need to stay in the hospital overnight for further treatment. °· Tell your health care provider if you are taking any medicines, including prescription medicines, over-the-counter medicines, and natural remedies. Some medicines, such as blood thinners (anticoagulants) and aspirin, may increase the chance of complications. Also tell your health care provider whether you have had alcohol or are taking illegal drugs. This information may affect treatment. °· Your health care provider will send you home with important instructions to follow. °· How fast you will recover from a concussion depends on many factors. These factors include how severe your concussion is, what part of your brain was injured, your age, and how healthy you were before the concussion. °· Most people with mild injuries recover fully. Recovery can take time. In general, recovery is slower in older persons. Also, persons who have had a concussion in the past or have other medical problems may find that it takes longer to recover from their current injury. °HOME CARE INSTRUCTIONS °General Instructions °· Carefully follow the directions your health care provider gave you. °· Only take over-the-counter or prescription medicines for pain, discomfort, or  fever as directed by your health care provider. °· Take only those medicines that your health care provider has approved. °· Do not drink alcohol until your health care provider says you are well enough to do so. Alcohol and certain other drugs may slow your recovery and can put you at risk of further injury. °· If it is harder than usual to remember things, write them down. °· If you are easily distracted, try to do one thing at a time. For example, do not try to watch TV while fixing dinner. °· Talk with family members or close friends when making important decisions. °· Keep all follow-up appointments. Repeated evaluation of   your symptoms is recommended for your recovery. °· Watch your symptoms and tell others to do the same. Complications sometimes occur after a concussion. Older adults with a brain injury may have a higher risk of serious complications, such as a blood clot on the brain. °· Tell your teachers, school nurse, school counselor, coach, athletic trainer, or work manager about your injury, symptoms, and restrictions. Tell them about what you can or cannot do. They should watch for: °¨ Increased problems with attention or concentration. °¨ Increased difficulty remembering or learning new information. °¨ Increased time needed to complete tasks or assignments. °¨ Increased irritability or decreased ability to cope with stress. °¨ Increased symptoms. °· Rest. Rest helps the brain to heal. Make sure you: °¨ Get plenty of sleep at night. Avoid staying up late at night. °¨ Keep the same bedtime hours on weekends and weekdays. °¨ Rest during the day. Take daytime naps or rest breaks when you feel tired. °· Limit activities that require a lot of thought or concentration. These include: °¨ Doing homework or job-related work. °¨ Watching TV. °¨ Working on the computer. °· Avoid any situation where there is potential for another head injury (football, hockey, soccer, basketball, martial arts, downhill snow  sports and horseback riding). Your condition will get worse every time you experience a concussion. You should avoid these activities until you are evaluated by the appropriate follow-up health care providers. °Returning To Your Regular Activities °You will need to return to your normal activities slowly, not all at once. You must give your body and brain enough time for recovery. °· Do not return to sports or other athletic activities until your health care provider tells you it is safe to do so. °· Ask your health care provider when you can drive, ride a bicycle, or operate heavy machinery. Your ability to react may be slower after a brain injury. Never do these activities if you are dizzy. °· Ask your health care provider about when you can return to work or school. °Preventing Another Concussion °It is very important to avoid another brain injury, especially before you have recovered. In rare cases, another injury can lead to permanent brain damage, brain swelling, or death. The risk of this is greatest during the first 7-10 days after a head injury. Avoid injuries by: °· Wearing a seat belt when riding in a car. °· Drinking alcohol only in moderation. °· Wearing a helmet when biking, skiing, skateboarding, skating, or doing similar activities. °· Avoiding activities that could lead to a second concussion, such as contact or recreational sports, until your health care provider says it is okay. °· Taking safety measures in your home. °¨ Remove clutter and tripping hazards from floors and stairways. °¨ Use grab bars in bathrooms and handrails by stairs. °¨ Place non-slip mats on floors and in bathtubs. °¨ Improve lighting in dim areas. °SEEK MEDICAL CARE IF: °· You have increased problems paying attention or concentrating. °· You have increased difficulty remembering or learning new information. °· You need more time to complete tasks or assignments than before. °· You have increased irritability or decreased  ability to cope with stress. °· You have more symptoms than before. °Seek medical care if you have any of the following symptoms for more than 2 weeks after your injury: °· Lasting (chronic) headaches. °· Dizziness or balance problems. °· Nausea. °· Vision problems. °· Increased sensitivity to noise or light. °· Depression or mood swings. °· Anxiety or irritability. °· Memory   problems. °· Difficulty concentrating or paying attention. °· Sleep problems. °· Feeling tired all the time. °SEEK IMMEDIATE MEDICAL CARE IF: °· You have severe or worsening headaches. These may be a sign of a blood clot in the brain. °· You have weakness (even if only in one hand, leg, or part of the face). °· You have numbness. °· You have decreased coordination. °· You vomit repeatedly. °· You have increased sleepiness. °· One pupil is larger than the other. °· You have convulsions. °· You have slurred speech. °· You have increased confusion. This may be a sign of a blood clot in the brain. °· You have increased restlessness, agitation, or irritability. °· You are unable to recognize people or places. °· You have neck pain. °· It is difficult to wake you up. °· You have unusual behavior changes. °· You lose consciousness. °MAKE SURE YOU: °· Understand these instructions. °· Will watch your condition. °· Will get help right away if you are not doing well or get worse. °Document Released: 02/23/2004 Document Revised: 12/08/2013 Document Reviewed: 06/25/2013 °[employer]® Patient Information ©2015 [employer]. This information is not intended to replace advice given to you by your health care provider. Make sure you discuss any questions you have with your health care provider. ° °

## 2015-04-01 ENCOUNTER — Ambulatory Visit: Payer: Self-pay | Admitting: Family

## 2015-04-01 DIAGNOSIS — Z0289 Encounter for other administrative examinations: Secondary | ICD-10-CM

## 2015-05-05 ENCOUNTER — Ambulatory Visit: Payer: Self-pay | Admitting: Family Medicine

## 2015-05-05 DIAGNOSIS — Z0289 Encounter for other administrative examinations: Secondary | ICD-10-CM

## 2016-05-09 ENCOUNTER — Emergency Department (HOSPITAL_COMMUNITY)
Admission: EM | Admit: 2016-05-09 | Discharge: 2016-05-09 | Disposition: A | Discharge status: Home / Self Care | Payer: 59 | Attending: Emergency Medicine | Admitting: Emergency Medicine

## 2016-05-09 ENCOUNTER — Encounter (HOSPITAL_COMMUNITY): Payer: Self-pay

## 2016-05-09 DIAGNOSIS — J45909 Unspecified asthma, uncomplicated: Secondary | ICD-10-CM | POA: Insufficient documentation

## 2016-05-09 DIAGNOSIS — M545 Low back pain: Secondary | ICD-10-CM

## 2016-05-09 DIAGNOSIS — M5441 Lumbago with sciatica, right side: Secondary | ICD-10-CM | POA: Insufficient documentation

## 2016-05-09 DIAGNOSIS — M5442 Lumbago with sciatica, left side: Secondary | ICD-10-CM | POA: Diagnosis not present

## 2016-05-09 DIAGNOSIS — Z791 Long term (current) use of non-steroidal anti-inflammatories (NSAID): Secondary | ICD-10-CM | POA: Insufficient documentation

## 2016-05-09 MED ORDER — METHOCARBAMOL 500 MG PO TABS: 500.0000 mg | ORAL_TABLET | Freq: Two times a day (BID) | ORAL | Status: DC | PRN

## 2016-05-09 MED ORDER — NAPROXEN 250 MG PO TABS: 250.0000 mg | ORAL_TABLET | Freq: Two times a day (BID) | ORAL | Status: DC

## 2016-05-09 NOTE — ED Provider Notes (Signed)
CSN: [id number]     Arrival date & time 05/09/16  1351 History  By signing my name below, I, Ronney LionSuzanne Le, attest that this documentation has been prepared under the direction and in the presence of Will Kerwin Augustus, PA-C. Electronically Signed: Ronney LionSuzanne Le, ED Scribe. 05/09/2016. 2:22 PM.    Chief Complaint  Patient presents with  . Leg Pain  . Back Pain   The history is provided by the patient. No language interpreter was used.   HPI Comments: John Barker is a 20 y.o. male with a history of ADHD, anxiety, asthma, who presents to the Emergency Department complaining of gradual-onset, constant, 7/10 generalized, stabbing, non-radiating back pain and bilateral leg pain that began when he woke up 2 weeks ago but has been chronic intermittently for several years. He denies any recent falls or injuries. He states his back pain is worse with standing or movement, and it sometimes will radiate down his legs. Currently it does not go down his legs. Patient states he has not tried any treatments or medications for his symptoms. He reports he has had back pain for intermittently for "a long time," with no known diagnosis. He denies a history of cancer or IVDU.  He denies leg swelling, bowel or bladder incontinence, difficulty urinating, dysuria, hematuria, urgency, fever, chills, cough, chest pain, SOB, abdominal pain, nausea, vomiting, diarrhea, or saddle anesthesia.    Past Medical History  Diagnosis Date  . ADHD (attention deficit hyperactivity disorder)   . Anxiety   . Vision abnormalities     Wears glasses  . Asthma    Past Surgical History  Procedure Laterality Date  . No past surgeries     Family History  Problem Relation Age of Onset  . Bipolar disorder Paternal Grandmother   . Depression Maternal Grandmother   . Heart disease Paternal Grandfather   . Hypertension Paternal Grandfather    Social History  Substance Use Topics  . Smoking status: Never Smoker   . Smokeless tobacco: None  .  Alcohol Use: No    Review of Systems  Constitutional: Negative for fever and chills.  Eyes: Negative for visual disturbance.  Respiratory: Negative for cough and shortness of breath.   Cardiovascular: Negative for chest pain and leg swelling.  Gastrointestinal: Negative for nausea, vomiting, abdominal pain and diarrhea.  Genitourinary: Negative for dysuria, urgency, frequency, hematuria, decreased urine volume and difficulty urinating.  Musculoskeletal: Positive for back pain and arthralgias (bilateral legs). Negative for joint swelling.  Skin: Negative for rash and wound.  Neurological: Negative for syncope, weakness and numbness.      Allergies  Review of patient's allergies indicates no known allergies.  Home Medications   Prior to Admission medications   Medication Sig Start Date End Date Taking? Authorizing Provider  acetaminophen (TYLENOL) 500 MG tablet Take 1,500 mg by mouth every 6 (six) hours as needed for mild pain or moderate pain.    Historical Provider, MD  amphetamine-dextroamphetamine (ADDERALL) 20 MG tablet Take 20 mg by mouth daily.    Historical Provider, MD  Melatonin 1 MG CAPS Take 3 mg by mouth at bedtime as needed. For sleep...usually takes 1 tablet, but if sleep is needed quickly, will take 2 tablets.    Historical Provider, MD  methocarbamol (ROBAXIN) 500 MG tablet Take 1 tablet (500 mg total) by mouth 2 (two) times daily as needed for muscle spasms. 05/09/16   Everlene FarrierWilliam Delvon Chipps, PA-C  naproxen (NAPROSYN) 250 MG tablet Take 1 tablet (250 mg total) by mouth  2 (two) times daily with a meal. 05/09/16   Everlene Farrier, PA-C   BP 150/78 mmHg  Pulse 85  Temp(Src) 98.3 F (36.8 C) (Oral)  Resp 18  SpO2 98% Physical Exam  Constitutional: He appears well-developed and well-nourished. No distress.  Nontoxic appearing.  HENT:  Head: Normocephalic and atraumatic.  Eyes: Right eye exhibits no discharge. Left eye exhibits no discharge.  Cardiovascular: Normal rate,  regular rhythm, normal heart sounds and intact distal pulses.   Bilateral radial, posterior tibialis and dorsalis pedis pulses are intact.    Pulmonary/Chest: Effort normal and breath sounds normal. No respiratory distress.  Abdominal: Soft. There is no tenderness.  Musculoskeletal: Normal range of motion. He exhibits no edema or tenderness.  No midline back tenderness. No back deformity, ecchymosis, deformity, edema, or crepitus.   No lower extremity edema or tenderness.  Strength is 5/5 in bilateral lower extremities.   Neurological: He is alert. He has normal reflexes. He displays normal reflexes. Coordination normal.  Normal gait. Bilateral patellar DTR's intact. Sensation is intact in his bilateral lower extremities.  Skin: Skin is warm and dry. No rash noted. He is not diaphoretic. No erythema. No pallor.  Psychiatric: He has a normal mood and affect. His behavior is normal.  Nursing note and vitals reviewed.   ED Course  Procedures (including critical care time)  DIAGNOSTIC STUDIES: Oxygen Saturation is 98% on RA, normal by my interpretation.    COORDINATION OF CARE: 2:21 PM - Discussed treatment plan with pt at bedside which includes Naprosyn, Robaxin, ice, and back exercises. Advised f/u with PCP Pt verbalized understanding and agreed to plan.    MDM   Meds given in ED:  Medications - No data to display  New Prescriptions   METHOCARBAMOL (ROBAXIN) 500 MG TABLET    Take 1 tablet (500 mg total) by mouth 2 (two) times daily as needed for muscle spasms.   NAPROXEN (NAPROSYN) 250 MG TABLET    Take 1 tablet (250 mg total) by mouth 2 (two) times daily with a meal.    Final diagnoses:  Bilateral low back pain, with sciatica presence unspecified    This  is a 20 y.o. male with a history of ADHD, anxiety, asthma, who presents to the Emergency Department complaining of gradual-onset, constant, 7/10 generalized, stabbing, non-radiating back pain and bilateral leg pain that began  when he woke up 2 weeks ago but has been chronic intermittently for several years. He denies any recent falls or injuries. He states his back pain is worse with standing or movement, and it sometimes will radiate down his legs. Currently it does not go down his legs. Patient states he has not tried any treatments or medications for his symptoms.  Patient with lower back pain that is worse with movement.   No neurological deficits and normal neuro exam.  Patient can walk but states is painful.  No loss of bowel or bladder control.  No concern for cauda equina.  No fever, night sweats, weight loss, h/o cancer, IVDU.  RICE protocol and pain medicine indicated (Naprosyn and Robaxin) and discussed with patient.  Pt advised to f/u with PCP.  Educated about back exercises. Pt verbalized understanding and agreed to plan.  I advised the patient to follow-up with their primary care provider this week. I advised the patient to return to the emergency department with new or worsening symptoms or new concerns. The patient verbalized understanding and agreement with plan.    I personally performed the  services described in this documentation, which was scribed in my presence. The recorded information has been reviewed and is accurate.         Everlene Farrier, PA-C 05/09/16 1431  Leta Baptist, MD 05/13/16 [phone]

## 2016-05-09 NOTE — ED Notes (Signed)
Pt presents with c/o bilateral leg pain and back pain. Pt denies any injury, reports he has been feeling this pain for about 2 weeks. Pt reports he is now having difficulty bending over. Pt also reports recently having nightmares, unsure as to whether his pain and the nightmares are related.

## 2016-05-09 NOTE — ED Notes (Signed)
PT DISCHARGED. INSTRUCTIONS AND PRESCRIPTIONS GIVEN. AAOX3. PT IN NO APPARENT DISTRESS. THE OPPORTUNITY TO ASK QUESTIONS WAS PROVIDED. 

## 2016-05-09 NOTE — Discharge Instructions (Signed)
Back Exercises °The following exercises strengthen the muscles that help to support the back. They also help to keep the lower back flexible. Doing these exercises can help to prevent back pain or lessen existing pain. °If you have back pain or discomfort, try doing these exercises 2-3 times each day or as told by your health care provider. When the pain goes away, do them once each day, but increase the number of times that you repeat the steps for each exercise (do more repetitions). If you do not have back pain or discomfort, do these exercises once each day or as told by your health care provider. °EXERCISES °Single Knee to Chest °Repeat these steps 3-5 times for each leg: °· Lie on your back on a firm bed or the floor with your legs extended. °· Bring one knee to your chest. Your other leg should stay extended and in contact with the floor. °· Hold your knee in place by grabbing your knee or thigh. °· Pull on your knee until you feel a gentle stretch in your lower back. °· Hold the stretch for 10-30 seconds. °· Slowly release and straighten your leg. °Pelvic Tilt °Repeat these steps 5-10 times: °· Lie on your back on a firm bed or the floor with your legs extended. °· Bend your knees so they are pointing toward the ceiling and your feet are flat on the floor. °· Tighten your lower abdominal muscles to press your lower back against the floor. This motion will tilt your pelvis so your tailbone points up toward the ceiling instead of pointing to your feet or the floor. °· With gentle tension and even breathing, hold this position for 5-10 seconds. °Cat-Cow °Repeat these steps until your lower back becomes more flexible: °· Get into a hands-and-knees position on a firm surface. Keep your hands under your shoulders, and keep your knees under your hips. You may place padding under your knees for comfort. °· Let your head hang down, and point your tailbone toward the floor so your lower back becomes rounded like the  back of a cat. °· Hold this position for 5 seconds. °· Slowly lift your head and point your tailbone up toward the ceiling so your back forms a sagging arch like the back of a cow. °· Hold this position for 5 seconds. °Press-Ups °Repeat these steps 5-10 times: °· Lie on your abdomen (face-down) on the floor. °· Place your palms near your head, about shoulder-width apart. °· While you keep your back as relaxed as possible and keep your hips on the floor, slowly straighten your arms to raise the top half of your body and lift your shoulders. Do not use your back muscles to raise your upper torso. You may adjust the placement of your hands to make yourself more comfortable. °· Hold this position for 5 seconds while you keep your back relaxed. °· Slowly return to lying flat on the floor. °Bridges °Repeat these steps 10 times: °1. Lie on your back on a firm surface. °2. Bend your knees so they are pointing toward the ceiling and your feet are flat on the floor. °3. Tighten your buttocks muscles and lift your buttocks off of the floor until your waist is at almost the same height as your knees. You should feel the muscles working in your buttocks and the back of your thighs. If you do not feel these muscles, slide your feet 1-2 inches farther away from your buttocks. °4. Hold this position for 3-5   seconds. °5. Slowly lower your hips to the starting position, and allow your buttocks muscles to relax completely. °If this exercise is too easy, try doing it with your arms crossed over your chest. °Abdominal Crunches °Repeat these steps 5-10 times: °1. Lie on your back on a firm bed or the floor with your legs extended. °2. Bend your knees so they are pointing toward the ceiling and your feet are flat on the floor. °3. Cross your arms over your chest. °4. Tip your chin slightly toward your chest without bending your neck. °5. Tighten your abdominal muscles and slowly raise your trunk (torso) high enough to lift your shoulder  blades a tiny bit off of the floor. Avoid raising your torso higher than that, because it can put too much stress on your low back and it does not help to strengthen your abdominal muscles. °6. Slowly return to your starting position. °Back Lifts °Repeat these steps 5-10 times: °1. Lie on your abdomen (face-down) with your arms at your sides, and rest your forehead on the floor. °2. Tighten the muscles in your legs and your buttocks. °3. Slowly lift your chest off of the floor while you keep your hips pressed to the floor. Keep the back of your head in line with the curve in your back. Your eyes should be looking at the floor. °4. Hold this position for 3-5 seconds. °5. Slowly return to your starting position. °SEEK MEDICAL CARE IF: °· Your back pain or discomfort gets much worse when you do an exercise. °· Your back pain or discomfort does not lessen within 2 hours after you exercise. °If you have any of these problems, stop doing these exercises right away. Do not do them again unless your health care provider says that you can. °SEEK IMMEDIATE MEDICAL CARE IF: °· You develop sudden, severe back pain. If this happens, stop doing the exercises right away. Do not do them again unless your health care provider says that you can. °  °This information is not intended to replace advice given to you by your health care provider. Make sure you discuss any questions you have with your health care provider. °  °Document Released: 01/10/2005 Document Revised: 08/24/2015 Document Reviewed: 01/27/2015 °Elsevier Interactive Patient Education ©2016 [employer] ° °Back Pain, Adult °Back pain is very common in adults. The cause of back pain is rarely dangerous and the pain often gets better over time. The cause of your back pain may not be known. Some common causes of back pain include: °· Strain of the muscles or ligaments supporting the spine. °· Wear and tear (degeneration) of the spinal disks. °· Arthritis. °· Direct injury  to the back. °For many people, back pain may return. Since back pain is rarely dangerous, most people can learn to manage this condition on their own. °HOME CARE INSTRUCTIONS °Watch your back pain for any changes. The following actions may help to lessen any discomfort you are feeling: °· Remain active. It is stressful on your back to sit or stand in one place for long periods of time. Do not sit, drive, or stand in one place for more than 30 minutes at a time. Take short walks on even surfaces as soon as you are able. Try to increase the length of time you walk each day. °· Exercise regularly as directed by your health care provider. Exercise helps your back heal faster. It also helps avoid future injury by keeping your muscles strong and flexible. °· Do not stay in   bed. Resting more than 1-2 days can delay your recovery. °· Pay attention to your body when you bend and lift. The most comfortable positions are those that put less stress on your recovering back. Always use proper lifting techniques, including: °¨ Bending your knees. °¨ Keeping the load close to your body. °¨ Avoiding twisting. °· Find a comfortable position to sleep. Use a firm mattress and lie on your side with your knees slightly bent. If you lie on your back, put a pillow under your knees. °· Avoid feeling anxious or stressed. Stress increases muscle tension and can worsen back pain. It is important to recognize when you are anxious or stressed and learn ways to manage it, such as with exercise. °· Take medicines only as directed by your health care provider. Over-the-counter medicines to reduce pain and inflammation are often the most helpful. Your health care provider may prescribe muscle relaxant drugs. These medicines help dull your pain so you can more quickly return to your normal activities and healthy exercise. °· Apply ice to the injured area: °¨ Put ice in a plastic bag. °¨ Place a towel between your skin and the bag. °¨ Leave the ice on  for 20 minutes, 2-3 times a day for the first 2-3 days. After that, ice and heat may be alternated to reduce pain and spasms. °· Maintain a healthy weight. Excess weight puts extra stress on your back and makes it difficult to maintain good posture. °SEEK MEDICAL CARE IF: °· You have pain that is not relieved with rest or medicine. °· You have increasing pain going down into the legs or buttocks. °· You have pain that does not improve in one week. °· You have night pain. °· You lose weight. °· You have a fever or chills. °SEEK IMMEDIATE MEDICAL CARE IF:  °· You develop new bowel or bladder control problems. °· You have unusual weakness or numbness in your arms or legs. °· You develop nausea or vomiting. °· You develop abdominal pain. °· You feel faint. °  °This information is not intended to replace advice given to you by your health care provider. Make sure you discuss any questions you have with your health care provider. °  °Document Released: 12/03/2005 Document Revised: 12/24/2014 Document Reviewed: 04/06/2014 °Elsevier Interactive Patient Education ©2016 [employer] ° °

## 2016-07-26 ENCOUNTER — Emergency Department (HOSPITAL_COMMUNITY)
Admission: EM | Admit: 2016-07-26 | Discharge: 2016-07-26 | Disposition: A | Discharge status: Home / Self Care | Payer: Medicaid Other | Attending: Emergency Medicine | Admitting: Emergency Medicine

## 2016-07-26 ENCOUNTER — Encounter (HOSPITAL_COMMUNITY): Payer: Self-pay | Admitting: Nurse Practitioner

## 2016-07-26 DIAGNOSIS — F1290 Cannabis use, unspecified, uncomplicated: Secondary | ICD-10-CM | POA: Diagnosis not present

## 2016-07-26 DIAGNOSIS — H9202 Otalgia, left ear: Secondary | ICD-10-CM | POA: Diagnosis present

## 2016-07-26 DIAGNOSIS — J45909 Unspecified asthma, uncomplicated: Secondary | ICD-10-CM | POA: Insufficient documentation

## 2016-07-26 DIAGNOSIS — H6092 Unspecified otitis externa, left ear: Secondary | ICD-10-CM

## 2016-07-26 DIAGNOSIS — F902 Attention-deficit hyperactivity disorder, combined type: Secondary | ICD-10-CM | POA: Diagnosis not present

## 2016-07-26 MED ORDER — CIPROFLOXACIN-DEXAMETHASONE 0.3-0.1 % OT SUSP: 4.0000 [drp] | Freq: Two times a day (BID) | OTIC | 0 refills | Status: DC

## 2016-07-26 NOTE — ED Triage Notes (Signed)
Patient presents to [facility] with complaints of left ear pain that he feels may be an ear infection. He states the pain radiates up and down his neck. Denies other associated symptoms. Rates pain 7/10. Has tried warm and cold compresses without relief, used an unknown ear drop at home, no otc pain relief medications. Did swim in the ocean 3 days ago and is concerned this could be related.

## 2016-07-26 NOTE — ED Provider Notes (Signed)
[facility] Provider Note   CSN: [id number] Arrival date & time: 07/26/16  1005  First Provider Contact:  None       History   Chief Complaint Chief Complaint  Patient presents with  . Otalgia    left    HPI John Barker is a 20 y.o. male.  HPI    20 year-old male presents today with left ear pain. Patient reports symptoms started 2 days ago after sitting in the ocean and poor. He reports the pain is worse with manipulation of the external ear, denies any mastoid tenderness, denies any fever or discharge, or any other upper respiratory complaints. Patient reports that he's been using oils at home with no improvement in symptoms. No over-the-counter medications used.    Past Medical History:  Diagnosis Date  . ADHD (attention deficit hyperactivity disorder)   . Anxiety   . Asthma   . Vision abnormalities    Wears glasses    Patient Active Problem List   Diagnosis Date Noted  . Depression, major, single episode, moderate (HCC) 01/10/2012  . Anxiety disorder 01/10/2012  . 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 NO PAST SURGERIES    . THROAT SURGERY Right 2007   unknown surgery    OB History    Gravida Para Term Preterm AB Living   0 0 0 0 0     SAB TAB Ectopic Multiple Live Births   0 0 0           Home Medications    Prior to Admission medications   Medication Sig Start Date End Date Taking? Authorizing Provider  acetaminophen (TYLENOL) 500 MG tablet Take 1,500 mg by mouth every 6 (six) hours as needed for mild pain or moderate pain.    Historical Provider, MD  amphetamine-dextroamphetamine (ADDERALL) 20 MG tablet Take 20 mg by mouth daily.    Historical Provider, MD  ciprofloxacin-dexamethasone (CIPRODEX) otic suspension Place 4 drops into the left ear 2 (two) times daily. 07/26/16   Eyvonne MechanicJeffrey Lorma Heater, PA-C  Melatonin 1 MG CAPS Take 3 mg by mouth at bedtime as needed.  For sleep...usually takes 1 tablet, but if sleep is needed quickly, will take 2 tablets.    Historical Provider, MD  methocarbamol (ROBAXIN) 500 MG tablet Take 1 tablet (500 mg total) by mouth 2 (two) times daily as needed for muscle spasms. 05/09/16   Everlene FarrierWilliam Dansie, PA-C  naproxen (NAPROSYN) 250 MG tablet Take 1 tablet (250 mg total) by mouth 2 (two) times daily with a meal. 05/09/16   Everlene FarrierWilliam Dansie, PA-C    Family History Family History  Problem Relation Age of Onset  . Bipolar disorder Paternal Grandmother   . Depression Maternal Grandmother   . Heart disease Paternal Grandfather   . Hypertension Paternal Grandfather     Social History Social History  Substance Use Topics  . Smoking status: Never Smoker  . Smokeless tobacco: Never Used  . Alcohol use Yes     Comment: socially     Allergies   Review of patient's allergies indicates no known allergies.   Review of Systems Review of Systems  All other systems reviewed and are negative.    Physical Exam Updated Vital Signs BP 118/72 (BP Location: Right Arm)   Pulse 99   Temp 98.1 F (36.7 C) (Oral)   Resp 16   SpO2 96%   Physical Exam  Constitutional: He is oriented to person,  place, and time. He appears well-developed and well-nourished.  HENT:  Head: Normocephalic and atraumatic.  Erythema and mild swelling to left external auditory canal, no discharge. Mild erythema to TM, no bulging. No mastoid tenderness, hearing intact.   No rhinorrhea  Eyes: Conjunctivae are normal. Pupils are equal, round, and reactive to light. Right eye exhibits no discharge. Left eye exhibits no discharge. No scleral icterus.  Neck: Normal range of motion. No JVD present. No tracheal deviation present.  Pulmonary/Chest: Effort normal. No stridor.  Neurological: He is alert and oriented to person, place, and time. Coordination normal.  Psychiatric: He has a normal mood and affect. His behavior is normal. Judgment and thought content  normal.  Nursing note and vitals reviewed.  ED Treatments / Results  Labs (all labs ordered are listed, but only abnormal results are displayed) Labs Reviewed - No data to display  EKG  EKG Interpretation None       Radiology No results found.  Procedures Procedures (including critical care time)  Medications Ordered in ED Medications - No data to display   Initial Impression / Assessment and Plan / ED Course  I have reviewed the triage vital signs and the nursing notes.  Pertinent labs & imaging results that were available during my care of the patient were reviewed by me and considered in my medical decision making (see chart for details).  Clinical Course    Final Clinical Impressions(s) / ED Diagnoses   Final diagnoses:  Otitis externa, left    Labs:   Imaging:  Consults:  Therapeutics:  Discharge Meds: Ciprodex  Assessment/Plan: 20 year old male presents today with otalgia, symptoms most consistent with otitis externa. Patient does have very minimal erythema to the TM, this looks less likely infectious and more likely irritative from patient trying home treatments. No tenderness to the mastoid, no fever. Patient be treated for otitis externa, encouraged follow-up with primary care, urgent care, the ED if symptoms persist or worsen. Patient verbalized understanding and agreement to today's plan had no further questions or concerns at the time discharge    New Prescriptions New Prescriptions   CIPROFLOXACIN-DEXAMETHASONE (CIPRODEX) OTIC SUSPENSION    Place 4 drops into the left ear 2 (two) times daily.     Eyvonne Mechanic, PA-C 07/26/16 1158    Nira Conn, MD 07/28/16 1135

## 2016-07-26 NOTE — ED Notes (Signed)
E-sign not available. Patient verbalizes understanding of discharge instructions and prescriptions given.

## 2016-09-10 ENCOUNTER — Encounter (HOSPITAL_COMMUNITY): Payer: Self-pay

## 2016-09-10 ENCOUNTER — Emergency Department (HOSPITAL_COMMUNITY)
Admission: EM | Admit: 2016-09-10 | Discharge: 2016-09-10 | Disposition: A | Discharge status: Home / Self Care | Payer: Medicaid Other | Attending: Emergency Medicine | Admitting: Emergency Medicine

## 2016-09-10 DIAGNOSIS — J45909 Unspecified asthma, uncomplicated: Secondary | ICD-10-CM | POA: Diagnosis not present

## 2016-09-10 DIAGNOSIS — J029 Acute pharyngitis, unspecified: Secondary | ICD-10-CM | POA: Diagnosis not present

## 2016-09-10 DIAGNOSIS — F909 Attention-deficit hyperactivity disorder, unspecified type: Secondary | ICD-10-CM | POA: Diagnosis not present

## 2016-09-10 DIAGNOSIS — R05 Cough: Secondary | ICD-10-CM | POA: Diagnosis present

## 2016-09-10 LAB — RAPID STREP SCREEN (MED CTR MEBANE ONLY): Streptococcus, Group A Screen (Direct): NEGATIVE

## 2016-09-10 NOTE — ED Triage Notes (Signed)
Pt states sore throat, congestion x few days.  Took left over antibiotics with no relief.  Fever 101.5. Girlfriend with same.

## 2016-09-10 NOTE — ED Notes (Signed)
PT DISCHARGED. INSTRUCTIONS GIVEN. AAOX4. PT IN NO APPARENT DISTRESS. THE OPPORTUNITY TO ASK QUESTIONS WAS PROVIDED. 

## 2016-09-10 NOTE — ED Provider Notes (Signed)
[facility] Provider Note   CSN: [id number] Arrival date & time: 09/10/16  1310  By signing my name below, I, John Barker, attest that this documentation has been prepared under the direction and in the presence of John LowerVrinda Kimberely Mccannon NP.  Electronically Signed: Vista Minkobert Barker, ED Scribe. 09/10/16. 4:27 PM.   History   Chief Complaint Chief Complaint  Patient presents with  . Sore Throat    HPI HPI Comments: John Barker is a 20 y.o. male who presents to the Emergency Department complaining of sore throat, nasal congestion and non-productive cough, onset two days ago. Pt's main complaint is constant sore throat that is exacerbated by swallowing. Pt states that he has a Hx of sore throats and took one leftover clindamycin, reports no relief. He also had 101 fever taken at home. Pt took one Benadryl and reports mild relief. He also reports intermittent headache recently. He denies shortness of breath, chest pain, nausea or vomiting.  The history is provided by the patient. No language interpreter was used.    Past Medical History:  Diagnosis Date  . ADHD (attention deficit hyperactivity disorder)   . Anxiety   . Asthma   . Vision abnormalities    Wears glasses    Patient Active Problem List   Diagnosis Date Noted  . Depression, major, single episode, moderate (HCC) 01/10/2012  . Anxiety disorder 01/10/2012  . 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 NO PAST SURGERIES    . THROAT SURGERY Right 2007   unknown surgery    OB History    Gravida Para Term Preterm AB Living   0 0 0 0 0     SAB TAB Ectopic Multiple Live Births   0 0 0           Home Medications    Prior to Admission medications   Medication Sig Start Date End Date Taking? Authorizing Provider  acetaminophen (TYLENOL) 500 MG tablet Take 1,500 mg by mouth every 6 (six) hours as needed for mild pain or moderate pain.     Historical Provider, MD  amphetamine-dextroamphetamine (ADDERALL) 20 MG tablet Take 20 mg by mouth daily.    Historical Provider, MD  ciprofloxacin-dexamethasone (CIPRODEX) otic suspension Place 4 drops into the left ear 2 (two) times daily. 07/26/16   Eyvonne MechanicJeffrey Hedges, PA-C  Melatonin 1 MG CAPS Take 3 mg by mouth at bedtime as needed. For sleep...usually takes 1 tablet, but if sleep is needed quickly, will take 2 tablets.    Historical Provider, MD  methocarbamol (ROBAXIN) 500 MG tablet Take 1 tablet (500 mg total) by mouth 2 (two) times daily as needed for muscle spasms. 05/09/16   Everlene FarrierWilliam Dansie, PA-C  naproxen (NAPROSYN) 250 MG tablet Take 1 tablet (250 mg total) by mouth 2 (two) times daily with a meal. 05/09/16   Everlene FarrierWilliam Dansie, PA-C    Family History Family History  Problem Relation Age of Onset  . Bipolar disorder Paternal Grandmother   . Depression Maternal Grandmother   . Heart disease Paternal Grandfather   . Hypertension Paternal Grandfather     Social History Social History  Substance Use Topics  . Smoking status: Never Smoker  . Smokeless tobacco: Never Used  . Alcohol use Yes     Comment: socially     Allergies   Review of patient's allergies indicates no known allergies.   Review of Systems Review of Systems  Constitutional: Positive for fever (101).  HENT: Positive for congestion and sore throat.   Respiratory: Negative for shortness of breath.   Cardiovascular: Negative for chest pain.  Gastrointestinal: Negative for nausea and vomiting.  All other systems reviewed and are negative.    Physical Exam Updated Vital Signs BP 126/90 (BP Location: Left Arm)   Pulse 93   Temp 98.3 F (36.8 C) (Oral)   Resp 16   SpO2 98%   Physical Exam  Constitutional: He appears well-developed and well-nourished.  HENT:  Right Ear: External ear normal.  Left Ear: External ear normal.  Mouth/Throat: Posterior oropharyngeal erythema present.  Neck: Normal range of  motion. Neck supple.  Pulmonary/Chest: Effort normal.  Abdominal: Soft.  Musculoskeletal: Normal range of motion.  Nursing note and vitals reviewed.    ED Treatments / Results  DIAGNOSTIC STUDIES: Oxygen Saturation is 98% on RA, normal by my interpretation.  COORDINATION OF CARE: 4:28 PM-Will order blood work. Discussed treatment plan with pt at bedside and pt agreed to plan.   Labs (all labs ordered are listed, but only abnormal results are displayed) Labs Reviewed  RAPID STREP SCREEN (NOT AT [facility])  CULTURE, GROUP A STREP [facility])    EKG  EKG Interpretation None       Radiology No results found.  Procedures Procedures (including critical care time)  Medications Ordered in ED Medications - No data to display   Initial Impression / Assessment and Plan / ED Course  I have reviewed the triage vital signs and the nursing notes.  Pertinent labs & imaging results that were available during my care of the patient were reviewed by me and considered in my medical decision making (see chart for details).  Clinical Course    Likely viral. No sign of pta  Final Clinical Impressions(s) / ED Diagnoses   Final diagnoses:  None    New Prescriptions New Prescriptions   No medications on file  I personally performed the services described in this documentation, which was scribed in my presence. The recorded information has been reviewed and is accurate.    John Lower, NP 09/10/16 2206    Mancel Bale, MD 09/14/16 [phone]

## 2016-09-12 LAB — CULTURE, GROUP A STREP (THRC)

## 2017-05-21 DIAGNOSIS — J4521 Mild intermittent asthma with (acute) exacerbation: Secondary | ICD-10-CM | POA: Insufficient documentation

## 2017-05-21 DIAGNOSIS — F909 Attention-deficit hyperactivity disorder, unspecified type: Secondary | ICD-10-CM | POA: Insufficient documentation

## 2017-05-21 DIAGNOSIS — R0789 Other chest pain: Secondary | ICD-10-CM | POA: Insufficient documentation

## 2017-05-21 DIAGNOSIS — R42 Dizziness and giddiness: Secondary | ICD-10-CM | POA: Insufficient documentation

## 2017-05-21 NOTE — ED Triage Notes (Signed)
Pt here for chest pain and head face numbness afer smoking at hookah bar for 2.5 hours. Denies hx of tobacco use sts does occasionally does use marijuana but "dont inhale just holds in mouth and exhales'

## 2017-05-22 ENCOUNTER — Emergency Department (HOSPITAL_COMMUNITY): Payer: Self-pay

## 2017-05-22 ENCOUNTER — Encounter (HOSPITAL_COMMUNITY): Payer: Self-pay

## 2017-05-22 ENCOUNTER — Emergency Department (HOSPITAL_COMMUNITY)
Admission: EM | Admit: 2017-05-22 | Discharge: 2017-05-22 | Disposition: A | Discharge status: Home / Self Care | Payer: Self-pay | Attending: Emergency Medicine | Admitting: Emergency Medicine

## 2017-05-22 DIAGNOSIS — R0789 Other chest pain: Secondary | ICD-10-CM

## 2017-05-22 DIAGNOSIS — J4520 Mild intermittent asthma, uncomplicated: Secondary | ICD-10-CM

## 2017-05-22 DIAGNOSIS — R42 Dizziness and giddiness: Secondary | ICD-10-CM

## 2017-05-22 LAB — CBC
HEMATOCRIT: 44.1 % (ref 39.0–52.0)
HEMOGLOBIN: 14.7 g/dL (ref 13.0–17.0)
MCH: 29.7 pg (ref 26.0–34.0)
MCHC: 33.3 g/dL (ref 30.0–36.0)
MCV: 89.1 fL (ref 78.0–100.0)
Platelets: 259 10*3/uL (ref 150–400)
RBC: 4.95 MIL/uL (ref 4.22–5.81)
RDW: 12.3 % (ref 11.5–15.5)
WBC: 8.1 10*3/uL (ref 4.0–10.5)

## 2017-05-22 LAB — I-STAT TROPONIN, ED: Troponin i, poc: 0.01 ng/mL (ref 0.00–0.08)

## 2017-05-22 LAB — BASIC METABOLIC PANEL
ANION GAP: 8 (ref 5–15)
BUN: 10 mg/dL (ref 6–20)
CO2: 27 mmol/L (ref 22–32)
Calcium: 8.7 mg/dL — ABNORMAL LOW (ref 8.9–10.3)
Chloride: 101 mmol/L (ref 101–111)
Creatinine, Ser: 0.96 mg/dL (ref 0.61–1.24)
GFR calc Af Amer: >60 mL/min (ref >60)
Glucose, Bld: 94 mg/dL (ref 65–99)
POTASSIUM: 3.5 mmol/L (ref 3.5–5.1)
SODIUM: 136 mmol/L (ref 135–145)

## 2017-05-22 MED ORDER — IBUPROFEN 400 MG PO TABS
600.0000 mg | ORAL_TABLET | Freq: Once | ORAL | Status: AC
  Administered 2017-05-22: 04:00:00 600 mg via ORAL
  Filled 2017-05-22: qty 1

## 2017-05-22 MED ORDER — ALBUTEROL SULFATE HFA 108 (90 BASE) MCG/ACT IN AERS
2.0000 | INHALATION_SPRAY | RESPIRATORY_TRACT | Status: DC | PRN
  Administered 2017-05-22: 2 via RESPIRATORY_TRACT
  Filled 2017-05-22: qty 6.7

## 2017-05-22 NOTE — ED Provider Notes (Signed)
[facility]-EMERGENCY DEPT Provider Note   CSN: [id number] Arrival date & time: 05/21/17  2353  By signing my name below, I, Bing Neighbors., attest that this documentation has been prepared under the direction and in the presence of Ronzell Laban, Mayer Masker, MD. Electronically signed: Bing Neighbors., ED Scribe. 05/22/17. 4:25 AM.   History   Chief Complaint Chief Complaint  Patient presents with  . Chest Pain  . Headache    HPI John Barker is a 21 y.o. male with hx of seasonal asthma, depression who presents to the Emergency Department complaining of constance chest pain with onset x3 days. Pt states that he developed chest pain after smoking hookah x3 days ago. He describes the pain as pressure and rates the pain 6/10. Pt reports headache, cough, dizziness, SOB. He denies any modifying factors. Pt denies fever, chills, nausea, vomiting, diarrhea. Of note, pt reports marijuana use.He has not tried anything for his symptoms.   The history is provided by the patient. No language interpreter was used.    Past Medical History:  Diagnosis Date  . ADHD (attention deficit hyperactivity disorder)   . Anxiety   . Asthma   . Vision abnormalities    Wears glasses    Patient Active Problem List   Diagnosis Date Noted  . Depression, major, single episode, moderate (HCC) 01/10/2012  . Anxiety disorder 01/10/2012  . 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 NO PAST SURGERIES    . THROAT SURGERY Right 2007   unknown surgery       Home Medications    Prior to Admission medications   Medication Sig Start Date End Date Taking? Authorizing Provider  ciprofloxacin-dexamethasone (CIPRODEX) otic suspension Place 4 drops into the left ear 2 (two) times daily. Patient not taking: Reported on 05/22/2017 07/26/16   Hedges, Tinnie Gens, PA-C  methocarbamol (ROBAXIN) 500 MG tablet Take 1 tablet (500 mg total)  by mouth 2 (two) times daily as needed for muscle spasms. Patient not taking: Reported on 05/22/2017 05/09/16   Everlene Farrier, PA-C  naproxen (NAPROSYN) 250 MG tablet Take 1 tablet (250 mg total) by mouth 2 (two) times daily with a meal. Patient not taking: Reported on 05/22/2017 05/09/16   Everlene Farrier, PA-C    Family History Family History  Problem Relation Age of Onset  . Bipolar disorder Paternal Grandmother   . Depression Maternal Grandmother   . Heart disease Paternal Grandfather   . Hypertension Paternal Grandfather     Social History Social History  Substance Use Topics  . Smoking status: Never Smoker  . Smokeless tobacco: Never Used  . Alcohol use Yes     Comment: socially     Allergies   Bee pollen and Tomato   Review of Systems Review of Systems  Constitutional: Negative for fever.  Respiratory: Positive for cough and shortness of breath.   Cardiovascular: Positive for chest pain.  Gastrointestinal: Negative for diarrhea, nausea and vomiting.  Neurological: Positive for dizziness and headaches.  All other systems reviewed and are negative.    Physical Exam Updated Vital Signs BP 119/78   Pulse 76   Temp 99.1 F (37.3 C)   Resp 18   Wt 9.667 kg (21 lb 5 oz)   SpO2 92%   BMI 3.06 kg/m   Physical Exam  Constitutional: He is oriented to person, place, and time. He appears well-developed and well-nourished. No distress.  Overweight  HENT:  Head: Normocephalic and atraumatic.  Cardiovascular: Normal rate, regular rhythm and normal heart sounds.   No murmur heard. Pulmonary/Chest: Effort normal. No respiratory distress. He has wheezes. He exhibits tenderness.  Scant wheeze  Abdominal: Soft. Bowel sounds are normal. There is no tenderness. There is no rebound.  Musculoskeletal: He exhibits no edema.  Neurological: He is alert and oriented to person, place, and time.  Skin: Skin is warm and dry.  Psychiatric: He has a normal mood and affect.  Nursing  note and vitals reviewed.    ED Treatments / Results   DIAGNOSTIC STUDIES: Oxygen Saturation is 100% on RA, normal by my interpretation.   COORDINATION OF CARE: 4:25 AM-Discussed next steps with pt. Pt verbalized understanding and is agreeable with the plan.    Labs (all labs ordered are listed, but only abnormal results are displayed) Labs Reviewed  BASIC METABOLIC PANEL - Abnormal; Notable for the following:       Result Value   Calcium 8.7 (*)    All other components within normal limits  CBC  I-STAT TROPOININ, ED    EKG  EKG Interpretation  Date/Time:  Wednesday May 22 2017 00:01:03 EDT Ventricular Rate:  97 PR Interval:  138 QRS Duration: 78 QT Interval:  322 QTC Calculation: 408 R Axis:   52 Text Interpretation:  Normal sinus rhythm Normal ECG Confirmed by Ross MarcusHorton, Ameli Sangiovanni ([id number]) on 05/22/2017 2:34:32 AM       Radiology Dg Chest 2 View  Result Date: 05/22/2017 CLINICAL DATA:  Right-sided chest pain. EXAM: CHEST  2 VIEW COMPARISON:  None. FINDINGS: The cardiomediastinal contours are normal. The lungs are clear. Pulmonary vasculature is normal. No consolidation, pleural effusion, or pneumothorax. No acute osseous abnormalities are seen. IMPRESSION: Normal radiographs of the chest. Electronically Signed   By: Rubye OaksMelanie  Ehinger M.D.   On: 05/22/2017 00:41    Procedures Procedures (including critical care time)  Medications Ordered in ED Medications  albuterol (PROVENTIL HFA;VENTOLIN HFA) 108 (90 Base) MCG/ACT inhaler 2 puff (2 puffs Inhalation Given 05/22/17 0337)  ibuprofen (ADVIL,MOTRIN) tablet 600 mg (600 mg Oral Given 05/22/17 [id number])     Initial Impression / Assessment and Plan / ED Course  I have reviewed the triage vital signs and the nursing notes.  Pertinent labs & imaging results that were available during my care of the patient were reviewed by me and considered in my medical decision making (see chart for details).     Patient presents with chest  pain and dizziness following smoking 2 nights ago. He is nontoxic. Vital signs reassuring. Exam is fairly benign. He does have a scant wheeze. Reports occasional asthma symptoms with season change.  Workup including basic lab work, EKG, chest x-ray reviewed from triage. These are reassuring. Patient was given an inhaler with some improvement of symptoms. He is not orthostatic. Patient reassured.  After history, exam, and medical workup I feel the patient has been appropriately medically screened and is safe for discharge home. Pertinent diagnoses were discussed with the patient. Patient was given return precautions.   Final Clinical Impressions(s) / ED Diagnoses   Final diagnoses:  Atypical chest pain  Dizziness  Mild intermittent asthma without complication    New Prescriptions New Prescriptions   No medications on file   I personally performed the services described in this documentation, which was scribed in my presence. The recorded information has been reviewed and is accurate.    Shon BatonHorton, Dovie Kapusta F, MD 05/22/17 [phone]

## 2017-05-22 NOTE — Discharge Instructions (Signed)
You were seen today for multiple symptoms related to recent nuclear use. Your asthma may have been triggered causing you to have some chest pain and dizziness. Avoid inhalants in the future. The rest of her workup is reassuring.

## 2017-05-30 ENCOUNTER — Emergency Department (HOSPITAL_COMMUNITY)
Admission: EM | Admit: 2017-05-30 | Discharge: 2017-05-30 | Disposition: A | Discharge status: Home / Self Care | Payer: 59 | Attending: Emergency Medicine | Admitting: Emergency Medicine

## 2017-05-30 DIAGNOSIS — R059 Cough, unspecified: Secondary | ICD-10-CM

## 2017-05-30 DIAGNOSIS — J45909 Unspecified asthma, uncomplicated: Secondary | ICD-10-CM | POA: Insufficient documentation

## 2017-05-30 DIAGNOSIS — Z79899 Other long term (current) drug therapy: Secondary | ICD-10-CM | POA: Insufficient documentation

## 2017-05-30 DIAGNOSIS — R05 Cough: Secondary | ICD-10-CM | POA: Insufficient documentation

## 2017-05-30 DIAGNOSIS — F909 Attention-deficit hyperactivity disorder, unspecified type: Secondary | ICD-10-CM | POA: Insufficient documentation

## 2017-05-30 DIAGNOSIS — J029 Acute pharyngitis, unspecified: Secondary | ICD-10-CM

## 2017-05-30 LAB — RAPID STREP SCREEN (MED CTR MEBANE ONLY): Streptococcus, Group A Screen (Direct): NEGATIVE

## 2017-05-30 MED ORDER — DEXAMETHASONE SODIUM PHOSPHATE 10 MG/ML IJ SOLN
10.0000 mg | Freq: Once | INTRAMUSCULAR | Status: AC
  Administered 2017-05-30: 10 mg via INTRAMUSCULAR
  Filled 2017-05-30: qty 1

## 2017-05-30 MED ORDER — BENZONATATE 100 MG PO CAPS: 100.0000 mg | ORAL_CAPSULE | Freq: Three times a day (TID) | ORAL | 0 refills | Status: DC

## 2017-05-30 NOTE — ED Provider Notes (Signed)
Emergency Department Provider Note   I have reviewed the triage vital signs and the nursing notes.   HISTORY  Chief Complaint Sore Throat and Cough   HPI John Barker is a 21 y.o. male with PMH of ADHD and asthma presents to the emergency department for evaluation of sore throat and cough. Symptoms began approximately week ago with flulike symptoms, fatigue. Patient was seen in the emergency department at that time had a negative chest x-ray. Most of symptoms have resolved but he continues to have cough and sore throat. He states the cough and throat are keeping him awake. He denies any fever or shaking chills. No sick contacts. Denies chest pain. No abdominal discomfort, vomiting, diarrhea. No radiation of symptoms. He has tried over-the-counter medications with little to no relief. No other modifying factors. The patient has also been taking an old Clindamycin Rx for the last 2 days.   Past Medical History:  Diagnosis Date  . ADHD (attention deficit hyperactivity disorder)   . Anxiety   . Asthma   . Vision abnormalities    Wears glasses    Patient Active Problem List   Diagnosis Date Noted  . Depression, major, single episode, moderate (HCC) 01/10/2012  . Anxiety disorder 01/10/2012  . 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 NO PAST SURGERIES    . THROAT SURGERY Right 2007   unknown surgery    Current Outpatient Rx  . Order #: [id number] Class: Print  . Order #: [id number] Class: Print  . Order #: [id number] Class: Print  . Order #: [id number] Class: Print    Allergies Bee pollen and Tomato  Family History  Problem Relation Age of Onset  . Bipolar disorder Paternal Grandmother   . Depression Maternal Grandmother   . Heart disease Paternal Grandfather   . Hypertension Paternal Grandfather     Social History Social History  Substance Use Topics  . Smoking status: Never Smoker  .  Smokeless tobacco: Never Used  . Alcohol use Yes     Comment: socially    Review of Systems  Constitutional: No fever/chills. Positive fatigue.  Eyes: No visual changes. ENT: Positive sore throat. Cardiovascular: Denies chest pain. Respiratory: Denies shortness of breath. Positive cough.  Gastrointestinal: No abdominal pain.  No nausea, no vomiting.  No diarrhea.  No constipation. Genitourinary: Negative for dysuria. Musculoskeletal: Negative for back pain. Skin: Negative for rash. Neurological: Negative for headaches, focal weakness or numbness.  10-point ROS otherwise negative.  ____________________________________________   PHYSICAL EXAM:  VITAL SIGNS: ED Triage Vitals  Enc Vitals Group     BP 05/30/17 0722 (!) 131/92     Pulse Rate 05/30/17 0722 93     Resp 05/30/17 0722 18     Temp 05/30/17 0722 98.2 F (36.8 C)     Temp Source 05/30/17 0722 Oral     SpO2 05/30/17 0722 100 %     Weight 05/30/17 0723 230 lb (104.3 kg)     Height 05/30/17 0723 5\' 10"  (1.778 m)     Pain Score 05/30/17 0731 9   Constitutional: Alert and oriented. Well appearing and in no acute distress. Eyes: Conjunctivae are normal. Head: Atraumatic. Ears:  Healthy appearing ear canals and TMs bilaterally Nose: No congestion/rhinnorhea. Mouth/Throat: Mucous membranes are moist.  Oropharynx with enlarged tonsils bilaterally with no exudate. No PTA. No trismus. Managing oral secretions.  Neck: No stridor.  No meningeal signs.   Cardiovascular: Normal rate, regular rhythm.  Good peripheral circulation. Grossly normal heart sounds.   Respiratory: Normal respiratory effort.  No retractions. Lungs CTAB. Gastrointestinal: Soft and nontender. No distention.  Musculoskeletal: No lower extremity tenderness nor edema. No gross deformities of extremities. Neurologic:  Normal speech and language. No gross focal neurologic deficits are appreciated.  Skin:  Skin is warm, dry and intact. No rash  noted.  ____________________________________________   LABS (all labs ordered are listed, but only abnormal results are displayed)  Labs Reviewed  RAPID STREP SCREEN (NOT AT [facility])  CULTURE, GROUP A STREP [facility])   ____________________________________________   PROCEDURES  Procedure(s) performed:   Procedures  None ____________________________________________   INITIAL IMPRESSION / ASSESSMENT AND PLAN / ED COURSE  Pertinent labs & imaging results that were available during my care of the patient were reviewed by me and considered in my medical decision making (see chart for details).  Patient presents to the emergency department for evaluation of sore throat and cough. He said URI symptoms for the last week that has mostly resolved. There is no trismus or evidence of peritonsillar abscess. Very low suspicion for deep space neck infection. Patient is well-appearing and in no acute respiratory distress. He has been treating himself with clindamycin over the past 2 days from a leftover prescription at home. No other obvious source of infection.  08:45 AM Rapid strep negative. Discharging with Tessalon Perles for cough. Suspect viral process. Patient received Decadron in the ED. Discussed return precautions in detail.  At this time, I do not feel there is any life-threatening condition present. I have reviewed and discussed all results (EKG, imaging, lab, urine as appropriate), exam findings with patient. I have reviewed nursing notes and appropriate previous records.  I feel the patient is safe to be discharged home without further emergent workup. Discussed usual and customary return precautions. Patient and family (if present) verbalize understanding and are comfortable with this plan.  Patient will follow-up with their primary care provider. If they do not have a primary care provider, information for follow-up has been provided to them. All questions have been  answered.  ____________________________________________  FINAL CLINICAL IMPRESSION(S) / ED DIAGNOSES  Final diagnoses:  Sore throat  Cough     MEDICATIONS GIVEN DURING THIS VISIT:  Medications  dexamethasone (DECADRON) injection 10 mg (10 mg Intramuscular Given 05/30/17 0817)     NEW OUTPATIENT MEDICATIONS STARTED DURING THIS VISIT:  New Prescriptions   BENZONATATE (TESSALON) 100 MG CAPSULE    Take 1 capsule (100 mg total) by mouth every 8 (eight) hours.      Note:  This document was prepared using Dragon voice recognition software and may include unintentional dictation errors.  Alona Bene, MD Emergency Medicine   Rhianne Soman, Arlyss Repress, MD 05/30/17 [phone]

## 2017-05-30 NOTE — ED Triage Notes (Signed)
Pt c/o sore throat x 1 week with productive cough. Pt states he has been taking Tylenol, Naproxen and Clindamycin (left over from another prescription) and no relief. Pt unsure if he has had a fever.

## 2017-05-30 NOTE — Discharge Instructions (Signed)

## 2017-06-01 LAB — CULTURE, GROUP A STREP (THRC)

## 2017-12-27 DIAGNOSIS — J029 Acute pharyngitis, unspecified: Secondary | ICD-10-CM | POA: Diagnosis not present

## 2018-01-13 DIAGNOSIS — Z87891 Personal history of nicotine dependence: Secondary | ICD-10-CM | POA: Diagnosis not present

## 2018-01-13 DIAGNOSIS — R05 Cough: Secondary | ICD-10-CM | POA: Diagnosis not present

## 2018-01-13 DIAGNOSIS — J040 Acute laryngitis: Secondary | ICD-10-CM | POA: Diagnosis not present

## 2018-01-13 DIAGNOSIS — J309 Allergic rhinitis, unspecified: Secondary | ICD-10-CM | POA: Diagnosis not present

## 2018-02-04 DIAGNOSIS — R05 Cough: Secondary | ICD-10-CM | POA: Diagnosis not present

## 2018-02-04 DIAGNOSIS — R490 Dysphonia: Secondary | ICD-10-CM | POA: Diagnosis not present

## 2018-02-06 DIAGNOSIS — R05 Cough: Secondary | ICD-10-CM | POA: Diagnosis not present

## 2018-02-06 DIAGNOSIS — J449 Chronic obstructive pulmonary disease, unspecified: Secondary | ICD-10-CM | POA: Diagnosis not present

## 2018-02-10 DIAGNOSIS — Z87898 Personal history of other specified conditions: Secondary | ICD-10-CM | POA: Diagnosis not present

## 2018-02-10 DIAGNOSIS — K219 Gastro-esophageal reflux disease without esophagitis: Secondary | ICD-10-CM | POA: Diagnosis not present

## 2018-02-10 DIAGNOSIS — J342 Deviated nasal septum: Secondary | ICD-10-CM | POA: Diagnosis not present

## 2018-02-10 DIAGNOSIS — R490 Dysphonia: Secondary | ICD-10-CM | POA: Diagnosis not present

## 2018-02-17 ENCOUNTER — Institutional Professional Consult (permissible substitution): Payer: 59 | Admitting: Emergency Medicine

## 2018-04-14 ENCOUNTER — Institutional Professional Consult (permissible substitution): Payer: 59 | Admitting: Emergency Medicine

## 2018-12-30 DIAGNOSIS — R1012 Left upper quadrant pain: Secondary | ICD-10-CM | POA: Diagnosis not present

## 2018-12-30 DIAGNOSIS — R1033 Periumbilical pain: Secondary | ICD-10-CM | POA: Diagnosis not present

## 2018-12-30 DIAGNOSIS — R112 Nausea with vomiting, unspecified: Secondary | ICD-10-CM | POA: Diagnosis not present

## 2021-03-23 ENCOUNTER — Emergency Department (HOSPITAL_COMMUNITY)
Admission: EM | Admit: 2021-03-23 | Discharge: 2021-03-23 | Disposition: A | Discharge status: Home / Self Care | Payer: BC Managed Care – PPO | Attending: Emergency Medicine | Admitting: Emergency Medicine

## 2021-03-23 ENCOUNTER — Encounter (HOSPITAL_COMMUNITY): Payer: Self-pay | Admitting: Emergency Medicine

## 2021-03-23 ENCOUNTER — Emergency Department (HOSPITAL_COMMUNITY): Payer: BC Managed Care – PPO

## 2021-03-23 DIAGNOSIS — R059 Cough, unspecified: Secondary | ICD-10-CM | POA: Insufficient documentation

## 2021-03-23 DIAGNOSIS — J329 Chronic sinusitis, unspecified: Secondary | ICD-10-CM | POA: Diagnosis not present

## 2021-03-23 DIAGNOSIS — R0602 Shortness of breath: Secondary | ICD-10-CM | POA: Diagnosis not present

## 2021-03-23 DIAGNOSIS — R062 Wheezing: Secondary | ICD-10-CM | POA: Diagnosis not present

## 2021-03-23 MED ORDER — ALBUTEROL SULFATE HFA 108 (90 BASE) MCG/ACT IN AERS: 2.0000 | INHALATION_SPRAY | RESPIRATORY_TRACT | 0 refills | Status: DC | PRN

## 2021-03-23 MED ORDER — PREDNISONE 20 MG PO TABS
60.0000 mg | ORAL_TABLET | Freq: Once | ORAL | Status: AC
  Administered 2021-03-23: 60 mg via ORAL
  Filled 2021-03-23: qty 3

## 2021-03-23 MED ORDER — PREDNISONE 20 MG PO TABS: 40.0000 mg | ORAL_TABLET | Freq: Every day | ORAL | 0 refills | Status: AC

## 2021-03-23 MED ORDER — ALBUTEROL SULFATE HFA 108 (90 BASE) MCG/ACT IN AERS
4.0000 | INHALATION_SPRAY | Freq: Once | RESPIRATORY_TRACT | Status: AC
  Administered 2021-03-23: 4 via RESPIRATORY_TRACT
  Filled 2021-03-23: qty 6.7

## 2021-03-23 NOTE — Discharge Instructions (Signed)
Your x-ray today's visit did not show any pneumonia.  I have provided you with a short course of steroids, please take 2 tablets daily for the next 5 days.  Please be aware this medication can cause flushness, changes in appetite, insomnia.  You were also provided with a prescription for an inhaler, please use this as prescribed.  You experience any worsening symptoms such as shortness of breath, fever, pain please return to the emergency department.

## 2021-03-23 NOTE — ED Triage Notes (Signed)
Per pt, states he has asthma, states his inhaler doesn't seem to be working-nasal and chest congestion with cough

## 2021-03-23 NOTE — ED Provider Notes (Signed)
[name] Provider Note   CSN: [id number] Arrival date & time: 03/23/21  1630     History Chief Complaint  Patient presents with  . Nasal Congestion  . URI    John Barker is a 25 y.o. male.  25 y.o male with a PMH of ADHD, Anxiety, Asthma presents to the ED with a chief complaint of shortness of breath, cough x 1 week. He states he has had a cough with some yellow to green sputum, states he has recurrent coughing fits which make it very hard for him to catch his breath.  Endorses shortness of breath which is exacerbated with movement, reports he is to be able to go up a flight of stairs but now is unable to do so without using his inhaler.  States that he has been borrowing his sister's inhaler for comfort.  He went from using his inhaler 2-3 times a day, now using it 5-6 times a day.  He does not have any prior admissions due to asthma attacks, no prior intubations due to asthma.  States there is pain below his ribs which has been occurring from coughing so much, has tried some over-the-counter medication including hydroxyzine without much improvement in his symptoms.  He denies any fever, chest pain or bilateral leg swelling.  The history is provided by the patient.  URI Presenting symptoms: congestion and cough   Presenting symptoms: no sore throat   Severity:  Moderate Onset quality:  Gradual Duration:  2 weeks Timing:  Constant Progression:  Worsening Chronicity:  Recurrent Relieved by:  Inhaler Worsened by:  Movement Ineffective treatments:  Rest and inhaler Associated symptoms: sinus pain   Associated symptoms: no headaches   Risk factors: not elderly, no chronic cardiac disease, no chronic respiratory disease, no diabetes mellitus, no recent travel and no sick contacts        Past Medical History:  Diagnosis Date  . ADHD (attention deficit hyperactivity disorder)   . Anxiety   . Asthma   . Vision abnormalities    Wears glasses     Patient Active Problem List   Diagnosis Date Noted  . Depression, major, single episode, moderate (HCC) 01/10/2012  . Anxiety disorder 01/10/2012  . 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 NO PAST SURGERIES    . THROAT SURGERY Right 2007   unknown surgery       Family History  Problem Relation Age of Onset  . Bipolar disorder Paternal Grandmother   . Depression Maternal Grandmother   . Heart disease Paternal Grandfather   . Hypertension Paternal Grandfather     Social History   Tobacco Use  . Smoking status: Never Smoker  . Smokeless tobacco: Never Used  Substance Use Topics  . Alcohol use: Yes    Comment: socially  . Drug use: Yes    Types: Marijuana    Comment: once every 2 weeks    Home Medications Prior to Admission medications   Medication Sig Start Date End Date Taking? Authorizing Provider  albuterol (VENTOLIN HFA) 108 (90 Base) MCG/ACT inhaler Inhale 2 puffs into the lungs every 4 (four) hours as needed for wheezing or shortness of breath. 03/23/21 04/22/21 Yes Geniene List, Leonie Douglas, PA-C  predniSONE (DELTASONE) 20 MG tablet Take 2 tablets (40 mg total) by mouth daily for 5 days. 03/23/21 03/28/21 Yes Matis Monnier, PA-C  benzonatate (TESSALON) 100 MG capsule Take 1 capsule (100 mg total) by  mouth every 8 (eight) hours. 05/30/17   Long, Arlyss Repress, MD  ciprofloxacin-dexamethasone (CIPRODEX) otic suspension Place 4 drops into the left ear 2 (two) times daily. Patient not taking: Reported on 05/22/2017 07/26/16   Hedges, Tinnie Gens, PA-C  methocarbamol (ROBAXIN) 500 MG tablet Take 1 tablet (500 mg total) by mouth 2 (two) times daily as needed for muscle spasms. Patient not taking: Reported on 05/22/2017 05/09/16   Everlene Farrier, PA-C  naproxen (NAPROSYN) 250 MG tablet Take 1 tablet (250 mg total) by mouth 2 (two) times daily with a meal. Patient not taking: Reported on 05/22/2017 05/09/16   Everlene Farrier, PA-C    Allergies    Bee pollen and Tomato  Review of Systems   Review of Systems  HENT: Positive for congestion, sinus pressure and sinus pain. Negative for sore throat.   Respiratory: Positive for cough and shortness of breath.   Cardiovascular: Negative for chest pain.  Gastrointestinal: Negative for abdominal pain, nausea and vomiting.  Musculoskeletal: Negative for back pain.  Neurological: Negative for light-headedness and headaches.    Physical Exam Updated Vital Signs BP 127/82 (BP Location: Right Arm)   Pulse 86   Temp 98.9 F (37.2 C) (Tympanic)   Resp 18   SpO2 93%   Physical Exam Vitals and nursing note reviewed.  Constitutional:      Appearance: Normal appearance. He is not ill-appearing.  HENT:     Head: Normocephalic and atraumatic.     Nose: Nose normal.     Mouth/Throat:     Mouth: Mucous membranes are moist.     Comments: Oropharynx is erythematous, uvula is midline, no bilateral tonsillar exudates, no PTA noted. Eyes:     Pupils: Pupils are equal, round, and reactive to light.  Cardiovascular:     Rate and Rhythm: Normal rate.  Pulmonary:     Effort: Pulmonary effort is normal.     Breath sounds: Examination of the right-upper field reveals wheezing. Examination of the right-lower field reveals wheezing. Examination of the left-lower field reveals wheezing. Wheezing present.     Comments: Lungs with wheezing throughout. No tachypnea.  Abdominal:     General: Abdomen is flat.     Palpations: Abdomen is soft.     Tenderness: There is no right CVA tenderness or left CVA tenderness.  Musculoskeletal:     Cervical back: Normal range of motion and neck supple.  Skin:    General: Skin is warm and dry.  Neurological:     Mental Status: He is alert and oriented to person, place, and time.     ED Results / Procedures / Treatments   Labs (all labs ordered are listed, but only abnormal results are displayed) Labs Reviewed - No data to  display  EKG None  Radiology DG Chest 2 View  Result Date: 03/23/2021 CLINICAL DATA:  Cough. EXAM: CHEST - 2 VIEW COMPARISON:  February 06, 2018 FINDINGS: The heart size and mediastinal contours are within normal limits. Both lungs are clear. The visualized skeletal structures are unremarkable. IMPRESSION: No active cardiopulmonary disease. Electronically Signed   By: Aram Candela M.D.   On: 03/23/2021 17:57    Procedures Procedures   Medications Ordered in ED Medications  predniSONE (DELTASONE) tablet 60 mg (60 mg Oral Given 03/23/21 1916)  albuterol (VENTOLIN HFA) 108 (90 Base) MCG/ACT inhaler 4 puff (4 puffs Inhalation Given 03/23/21 1916)    ED Course  I have reviewed the triage vital signs and the nursing notes.  Pertinent  labs & imaging results that were available during my care of the patient were reviewed by me and considered in my medical decision making (see chart for details).  Clinical Course as of 03/23/21 1954  Thu Mar 23, 2021  1952 SpO2: 93 % [JS]    Clinical Course User Index [JS] Claude Manges, PA-C   MDM Rules/Calculators/A&P   Patient arrives to the ED with a chief complaint of shortness of breath, cough for the past 2 weeks.  States that he has been using his inhaler more frequently, went from using a twice a day to use any 5-6 times a day, having to borrow his sister's inhaler.  Reports no prior hospitalizations for asthma exacerbation, no prior intubations due to asthma.  Initial vital signs remarkable for oxygen saturation at 93%, no tachypnea present on my exam, lungs have wheezing present throughout.  There is no pain with elevation of the chest wall aside from lower ribs with coughing.  Does endorse a phlegm with this, however no coughing episodes while in the room.  He is overall nonill, nontoxic appearing.  Will be provided with inhaler at this time along with steroids to help with work of breathing.  X-ray without any pneumonia, or effusion or other  acute findings.  7:52 PM patient reevaluated by me after albuterol treatment along with steroid therapy.  He reports improvement in his symptoms, he was given a prescription for steroids along with a prescription for an inhaler along with provided with an inhaler while in the ED.  He does report improvement, and feels like he could treat his symptoms while at home. No prior hx of intubations due to asthma attacks.  Return precautions discussed at length, patient stable for discharge.   Portions of this note were generated with Scientist, clinical (histocompatibility and immunogenetics). Dictation errors may occur despite best attempts at proofreading.  Final Clinical Impression(s) / ED Diagnoses Final diagnoses:  Wheezing    Rx / DC Orders ED Discharge Orders         Ordered    predniSONE (DELTASONE) 20 MG tablet  Daily        03/23/21 1818    albuterol (VENTOLIN HFA) 108 (90 Base) MCG/ACT inhaler  Every 4 hours PRN        03/23/21 1818           Claude Manges, PA-C 03/23/21 1954    Rolan Bucco, MD 03/23/21 1958

## 2021-04-12 ENCOUNTER — Emergency Department (HOSPITAL_COMMUNITY): Payer: BC Managed Care – PPO

## 2021-04-12 ENCOUNTER — Emergency Department (HOSPITAL_COMMUNITY)
Admission: EM | Admit: 2021-04-12 | Discharge: 2021-04-12 | Disposition: A | Discharge status: Home / Self Care | Payer: BC Managed Care – PPO | Attending: Emergency Medicine | Admitting: Emergency Medicine

## 2021-04-12 ENCOUNTER — Encounter (HOSPITAL_COMMUNITY): Payer: Self-pay

## 2021-04-12 DIAGNOSIS — J45909 Unspecified asthma, uncomplicated: Secondary | ICD-10-CM | POA: Insufficient documentation

## 2021-04-12 DIAGNOSIS — R0602 Shortness of breath: Secondary | ICD-10-CM | POA: Diagnosis not present

## 2021-04-12 DIAGNOSIS — R059 Cough, unspecified: Secondary | ICD-10-CM | POA: Diagnosis not present

## 2021-04-12 DIAGNOSIS — J45901 Unspecified asthma with (acute) exacerbation: Secondary | ICD-10-CM

## 2021-04-12 MED ORDER — PREDNISONE 10 MG PO TABS: 20.0000 mg | ORAL_TABLET | Freq: Every day | ORAL | 0 refills | Status: DC

## 2021-04-12 MED ORDER — ALBUTEROL SULFATE (2.5 MG/3ML) 0.083% IN NEBU
2.5000 mg | INHALATION_SOLUTION | Freq: Once | RESPIRATORY_TRACT | Status: AC
  Administered 2021-04-12: 2.5 mg via RESPIRATORY_TRACT
  Filled 2021-04-12: qty 3

## 2021-04-12 MED ORDER — METHYLPREDNISOLONE SODIUM SUCC 125 MG IJ SOLR
125.0000 mg | Freq: Once | INTRAMUSCULAR | Status: AC
  Administered 2021-04-12: 125 mg via INTRAVENOUS
  Filled 2021-04-12: qty 2

## 2021-04-12 NOTE — ED Provider Notes (Signed)
[facility]-EMERGENCY DEPT Provider Note   CSN: [id number] Arrival date & time: 04/12/21  1447     History Chief Complaint  Patient presents with  . Shortness of Breath    John Barker is a 25 y.o. male with history of asthma, anxiety, ADHD, depression.  Patient presents with chief complaint of shortness of breath and wheezing.  He reports these symptoms are similar to how he was feeling when he was seen on 03/23/2021.  Per chart review patient was diagnosed with asthma exacerbation given oral steroid course as well as albuterol inhaler.  Patient reports that his symptoms improved during oral steroid course however they have been gradually worsening since completing.  Patient states that he has a history of seasonal allergies and feels that his asthma has been worse since spring started.  Patient reports that he is taking Zyrtec daily.    Patient reports that his shortness of breath and coughing comes on intermittently throughout the day and at night.  Feels that coughing and shortness of breath are worse at night and with exertion.  Patient reports using inhalers 6-20 times per day.  Patient denies taking any other medications for his asthma.  Patient denies seeing a primary care provider or pulmonologist for his asthma.  Patient reports that cough is occasionally productive.  When it is productive it produces clear mucus.  Patient denies any hemoptysis.  Patient endorses sneezing.  Patient endorses chest wall pain after "coughing fits."  Patient denies any fevers, chills, nasal congestion, chest pain, unilateral leg swelling or tenderness, hemoptysis, recent surgery or trauma, history of PE or DVT, hormone use.    Patient reports receiving influenza vaccine as well as COVID-19 vaccine x2.  Patient denies previous hospitalization or intubation for asthma exacerbation.  HPI     Past Medical History:  Diagnosis Date  . ADHD (attention deficit hyperactivity disorder)   .  Anxiety   . Asthma   . Vision abnormalities    Wears glasses    Patient Active Problem List   Diagnosis Date Noted  . Depression, major, single episode, moderate (HCC) 01/10/2012  . Anxiety disorder 01/10/2012  . 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 NO PAST SURGERIES    . THROAT SURGERY Right 2007   unknown surgery       Family History  Problem Relation Age of Onset  . Bipolar disorder Paternal Grandmother   . Depression Maternal Grandmother   . Heart disease Paternal Grandfather   . Hypertension Paternal Grandfather     Social History   Tobacco Use  . Smoking status: Never Smoker  . Smokeless tobacco: Never Used  Substance Use Topics  . Alcohol use: Yes    Comment: socially  . Drug use: Yes    Types: Marijuana    Comment: once every 2 weeks    Home Medications Prior to Admission medications   Medication Sig Start Date End Date Taking? Authorizing Provider  albuterol (VENTOLIN HFA) 108 (90 Base) MCG/ACT inhaler Inhale 2 puffs into the lungs every 4 (four) hours as needed for wheezing or shortness of breath. 03/23/21 04/22/21  Claude Manges, PA-C  benzonatate (TESSALON) 100 MG capsule Take 1 capsule (100 mg total) by mouth every 8 (eight) hours. 05/30/17   Long, Arlyss Repress, MD  ciprofloxacin-dexamethasone (CIPRODEX) otic suspension Place 4 drops into the left ear 2 (two) times daily. Patient not taking: Reported on 05/22/2017 07/26/16   Hedges,  Tinnie Gens, PA-C  methocarbamol (ROBAXIN) 500 MG tablet Take 1 tablet (500 mg total) by mouth 2 (two) times daily as needed for muscle spasms. Patient not taking: Reported on 05/22/2017 05/09/16   Everlene Farrier, PA-C  naproxen (NAPROSYN) 250 MG tablet Take 1 tablet (250 mg total) by mouth 2 (two) times daily with a meal. Patient not taking: Reported on 05/22/2017 05/09/16   Everlene Farrier, PA-C    Allergies    Bee pollen and Tomato  Review of Systems    Review of Systems  Constitutional: Negative for chills and fever.  HENT: Positive for sneezing. Negative for congestion, drooling, rhinorrhea, sore throat and trouble swallowing.   Eyes: Negative for discharge.  Respiratory: Positive for cough, shortness of breath and wheezing.   Cardiovascular: Negative for chest pain.  Gastrointestinal: Negative for abdominal pain, nausea and vomiting.  Musculoskeletal: Negative for back pain and neck pain.  Skin: Negative for color change and rash.  Neurological: Negative for dizziness, syncope, light-headedness and headaches.  Psychiatric/Behavioral: Negative for confusion.    Physical Exam Updated Vital Signs BP (!) 153/99 (BP Location: Left Arm)   Pulse 84   Temp 98.4 F (36.9 C) (Oral)   Resp 18   SpO2 98%   Physical Exam Vitals and nursing note reviewed.  Constitutional:      General: He is not in acute distress.    Appearance: He is obese. He is not ill-appearing, toxic-appearing or diaphoretic.  HENT:     Head: Normocephalic.     Jaw: No trismus or pain on movement.     Nose: No congestion or rhinorrhea.     Mouth/Throat:     Lips: Pink. No lesions.     Mouth: Mucous membranes are moist.     Pharynx: Oropharynx is clear. Uvula midline. No pharyngeal swelling, oropharyngeal exudate, posterior oropharyngeal erythema or uvula swelling.     Tonsils: No tonsillar exudate or tonsillar abscesses. 1+ on the right. 1+ on the left.  Eyes:     General: No scleral icterus.       Right eye: No discharge.        Left eye: No discharge.  Cardiovascular:     Rate and Rhythm: Normal rate.  Pulmonary:     Effort: Pulmonary effort is normal. No tachypnea, bradypnea, accessory muscle usage or respiratory distress.     Breath sounds: No stridor. Examination of the right-upper field reveals wheezing. Examination of the left-upper field reveals wheezing. Examination of the right-middle field reveals wheezing. Examination of the left-middle field  reveals wheezing. Examination of the right-lower field reveals wheezing. Examination of the left-lower field reveals wheezing. Wheezing present.     Comments: Inspiratory wheezing noted to all lung fields  Patient able to speak in full complete sentences without difficulty  Oxygen saturation 98% on room air Musculoskeletal:     Right lower leg: No swelling or tenderness. No edema.     Left lower leg: No swelling or tenderness. No edema.  Skin:    General: Skin is warm and dry.     Coloration: Skin is not cyanotic or pale.  Neurological:     General: No focal deficit present.     Mental Status: He is alert.  Psychiatric:        Behavior: Behavior is cooperative.     ED Results / Procedures / Treatments   Labs (all labs ordered are listed, but only abnormal results are displayed) Labs Reviewed - No data to display  EKG EKG Interpretation  Date/Time:  Wednesday April 12 2021 17:47:53 EDT Ventricular Rate:  84 PR Interval:  144 QRS Duration: 86 QT Interval:  342 QTC Calculation: 404 R Axis:   53 Text Interpretation: Normal sinus rhythm with sinus arrhythmia Normal ECG No significant change since last tracing Confirmed by Richardean Canal [phone]) on 04/12/2021 5:53:42 PM   Radiology DG Chest 2 View  Result Date: 04/12/2021 CLINICAL DATA:  Shortness of breath history of asthma EXAM: CHEST - 2 VIEW COMPARISON:  March 23, 2021 FINDINGS: The heart size and mediastinal contours are within normal limits. No focal consolidation. No pleural effusion. No pneumothorax. The visualized skeletal structures are unremarkable. IMPRESSION: No active cardiopulmonary disease. Electronically Signed   By: Maudry Mayhew MD   On: 04/12/2021 15:32    Procedures Procedures   Medications Ordered in ED Medications  methylPREDNISolone sodium succinate (SOLU-MEDROL) 125 mg/2 mL injection 125 mg (has no administration in time range)  albuterol (PROVENTIL) (2.5 MG/3ML) 0.083% nebulizer solution 2.5 mg (has  no administration in time range)    ED Course  I have reviewed the triage vital signs and the nursing notes.  Pertinent labs & imaging results that were available during my care of the patient were reviewed by me and considered in my medical decision making (see chart for details).    MDM Rules/Calculators/A&P                          Alert 25 year old male no acute distress, nontoxic-appearing.  Patient presents with chief complaint of shortness of breath, cough, and wheezing.  Reports increased rescue inhaler use.  Patient does not take any daily medications for his asthma.    On physical exam patient speaking in full complete sentences without difficulty.  Oxygen saturation 98% on room air.  Patient has expiratory wheezing in all lung fields.    Low suspicion for PE as patient can be ruled out with PERC criteria.   Chest x-ray shows no signs of pneumonia. Patient is afebrile denies any myalgias, fatigue, fevers, chills.  Patient vaccinated for COVID-19 and influenza.  Patient was offered COVID-19 and influenza testing however refuses at this time.  Concern for bronchitis versus asthma exacerbation.  Will give patient Solu-Medrol and albuterol nebulizer treatment in emergency department.   If improvement in his symptoms will discharge with oral course of steroids.  Patient will need to follow-up with primary care provider as he will likely need to be started on daily inhaled corticosteroids to manage his asthma symptoms.  Patient care transferred to [facility] at the end of my shift. Patient presentation, ED course, and plan of care discussed with review of all pertinent labs and imaging. Please see his/her note for further details regarding further ED course and disposition.  Final Clinical Impression(s) / ED Diagnoses Final diagnoses:  None    Rx / DC Orders ED Discharge Orders    None       Berneice Heinrich 04/13/21 0002    Charlynne Pander, MD 04/14/21 1459

## 2021-04-12 NOTE — ED Notes (Signed)
Pt ambulated in room unassisted with a steady gait. O2 reading 98% RA. Pt denies SOB at this time.

## 2021-04-12 NOTE — ED Triage Notes (Signed)
Emergency Medicine Provider Triage Evaluation Note  John Barker , a 25 y.o. male  was evaluated in triage.  Pt complains of SOB and wheezing, feels similar to previous asthma exacerbation. .  Review of Systems  Positive: SOB Negative: CP, fevers  Physical Exam  There were no vitals taken for this visit. Gen:   Awake, no distress   HEENT:  Atraumatic Resp:  Normal effort, no audible wheezing, full sentences Cardiac:  Normal rate  Abd:   Nondistended, nontender  MSK:   Moves extremities without difficulty  Neuro:  Speech clear  Medical Decision Making  Medically screening exam initiated at 3:07 PM.  Appropriate orders placed.  John Barker was informed that the remainder of the evaluation will be completed by another provider, this initial triage assessment does not replace that evaluation, and the importance of remaining in the ED until their evaluation is complete.  Clinical Impression  Stable, no resp distress   MSE was initiated and I personally evaluated the patient and placed orders (if any) at  3:08 PM on April 12, 2021.  The patient appears stable so that the remainder of the MSE may be completed by another provider.    Farrel Gordon, PA-C 04/12/21 1511

## 2021-04-12 NOTE — ED Triage Notes (Signed)
Pt arrived via walk in, c/o SOB. States he has hx of asthma, was seen recently for same and told flare up of asthma. Given rx for prednisone which he states helped and now worsening again.

## 2021-04-12 NOTE — ED Notes (Signed)
An After Visit Summary was printed and given to the patient. Discharge instructions given and no further questions at this time.  

## 2021-04-12 NOTE — Discharge Instructions (Signed)
You came to the emergency department today to be evaluated for your wheezing, coughing, and shortness of breath.  Your symptoms are likely due to an asthma exacerbation.  After this you received a steroid and albuterol nebulizer treatment while you are in the emergency department.  I have given you prescription for steroids.  It is very importantly follow-up with a primary care doctor he will likely need to be started on daily medications to help control your asthma.  I have given you a prescription for steroids today.  Some common side effects include feelings of extra energy, feeling warm, increased appetite, and stomach upset.  If you are diabetic your sugars may run higher than usual.    Get help right away if: You are getting worse and do not respond to treatment during an asthma attack. You are short of breath when at rest or when doing very little physical activity. You have difficulty eating, drinking, or talking. You have chest pain or tightness. You develop a fast heartbeat or palpitations. You have a bluish color to your lips or fingernails. You are light-headed or dizzy, or you faint. Your peak flow reading is less than 50% of your personal best. You feel too tired to breathe normally.

## 2022-07-16 IMAGING — CR DG CHEST 2V
2 series · 2 of 2 positions shown · non-contrast
Comparison: February 06, 2018

CLINICAL DATA: Cough.

EXAM:
CHEST - 2 VIEW

[w chest pa]
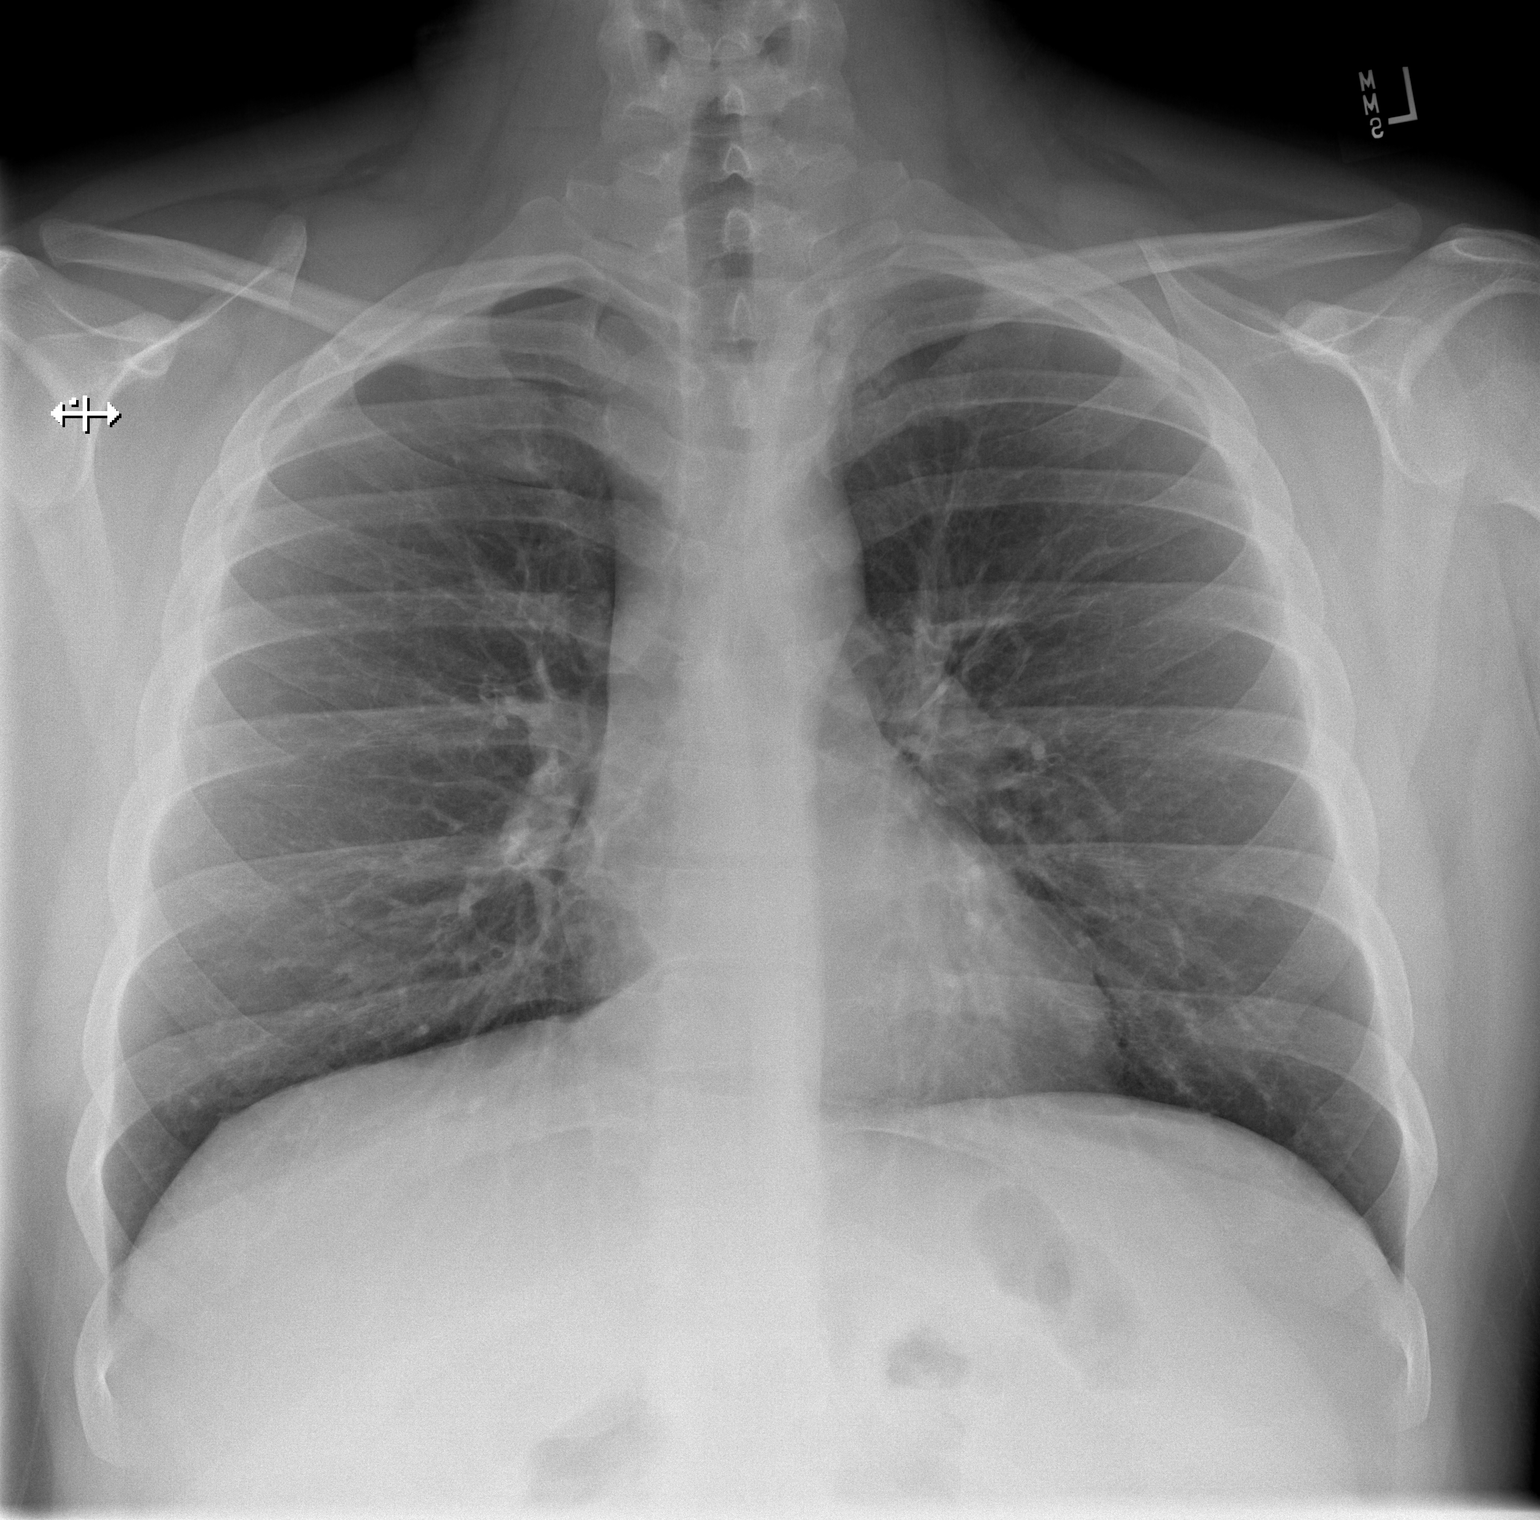

[w chest lat]
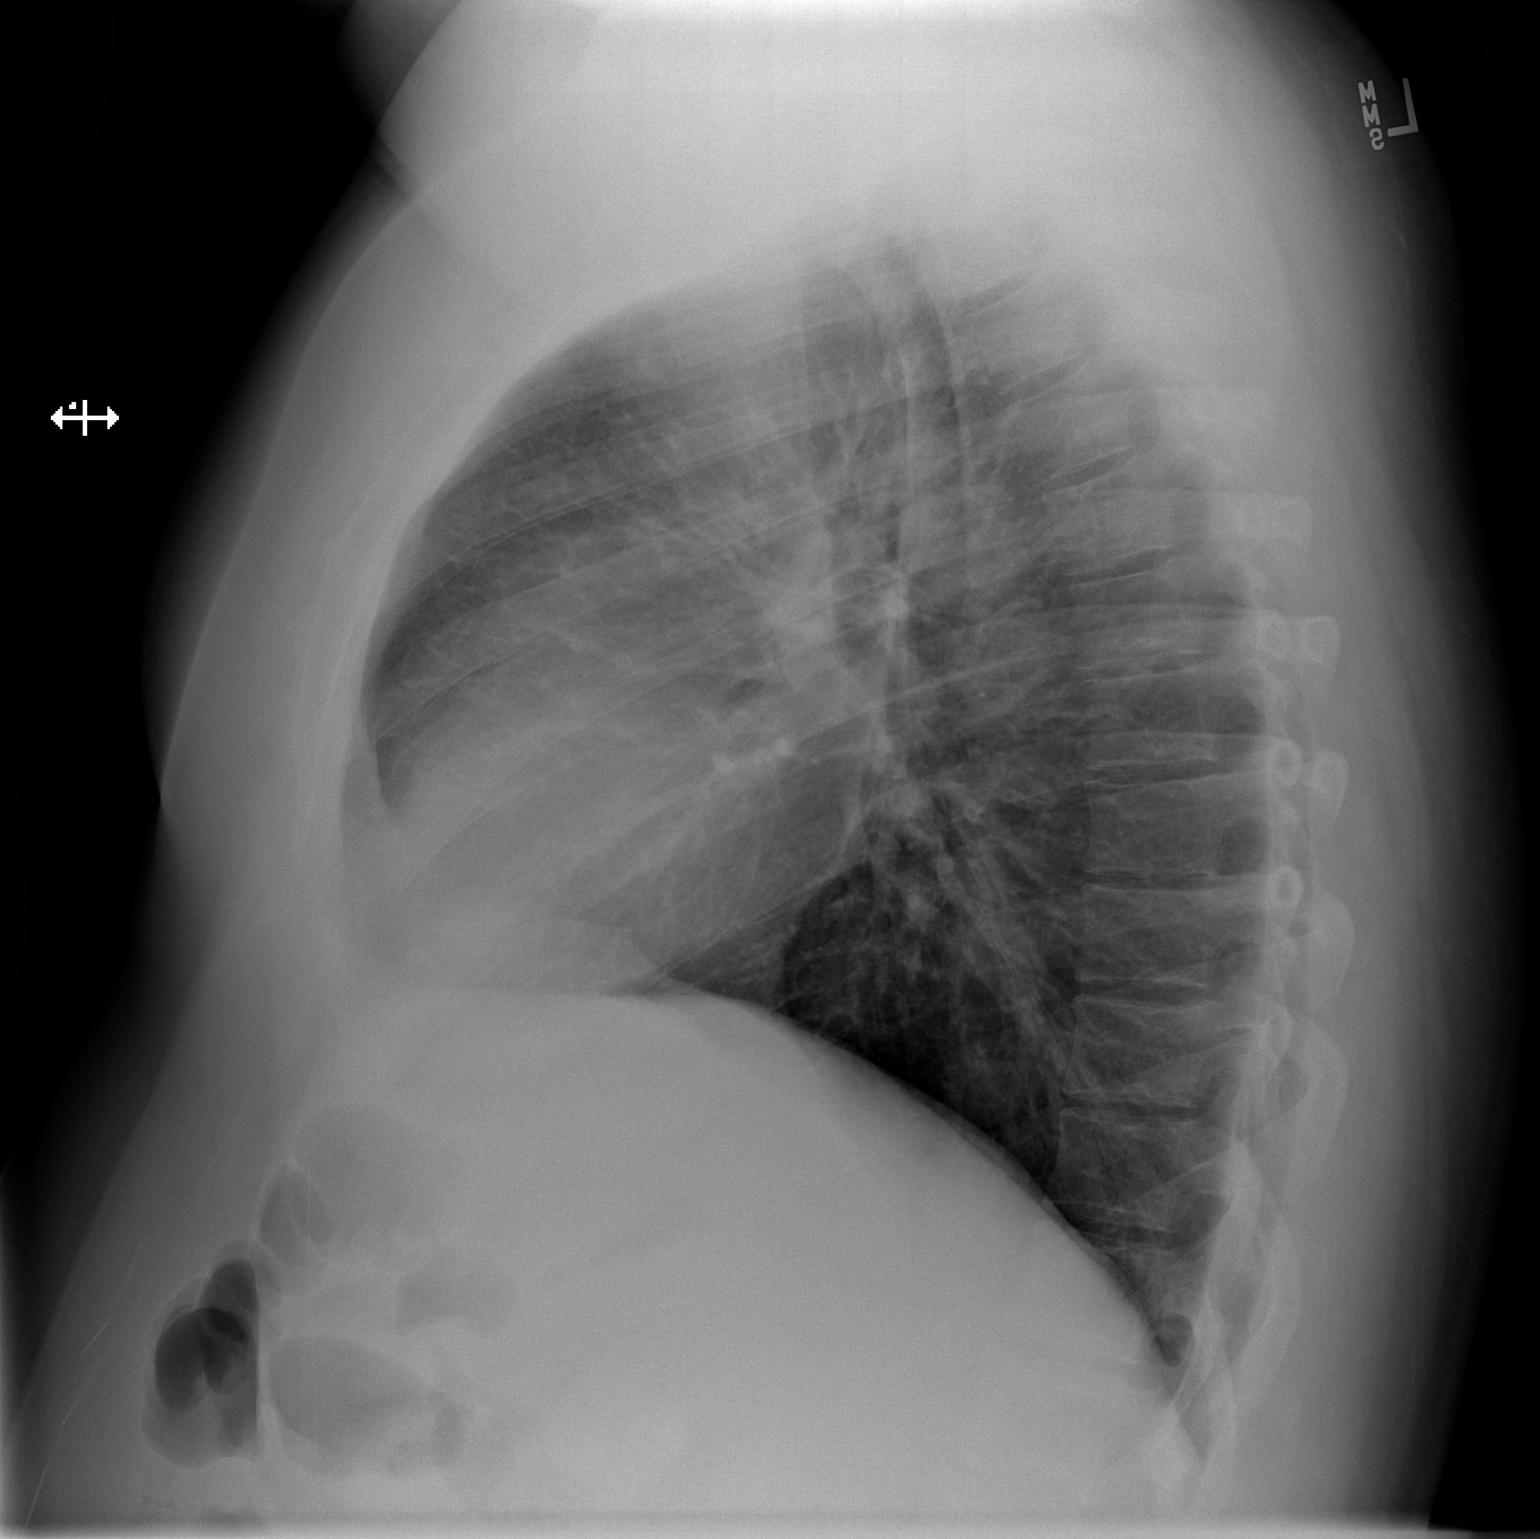

[2 of 2 positions shown; findings below may reference images not displayed]

FINDINGS: The heart size and mediastinal contours are within normal limits.
Both lungs are clear. The visualized skeletal structures are
unremarkable.
IMPRESSION: No active cardiopulmonary disease.

## 2022-08-05 IMAGING — CR DG CHEST 2V
2 series · 2 of 2 positions shown · non-contrast
Comparison: March 23, 2021

CLINICAL DATA: Shortness of breath history of asthma

EXAM:
CHEST - 2 VIEW

[w chest pa]
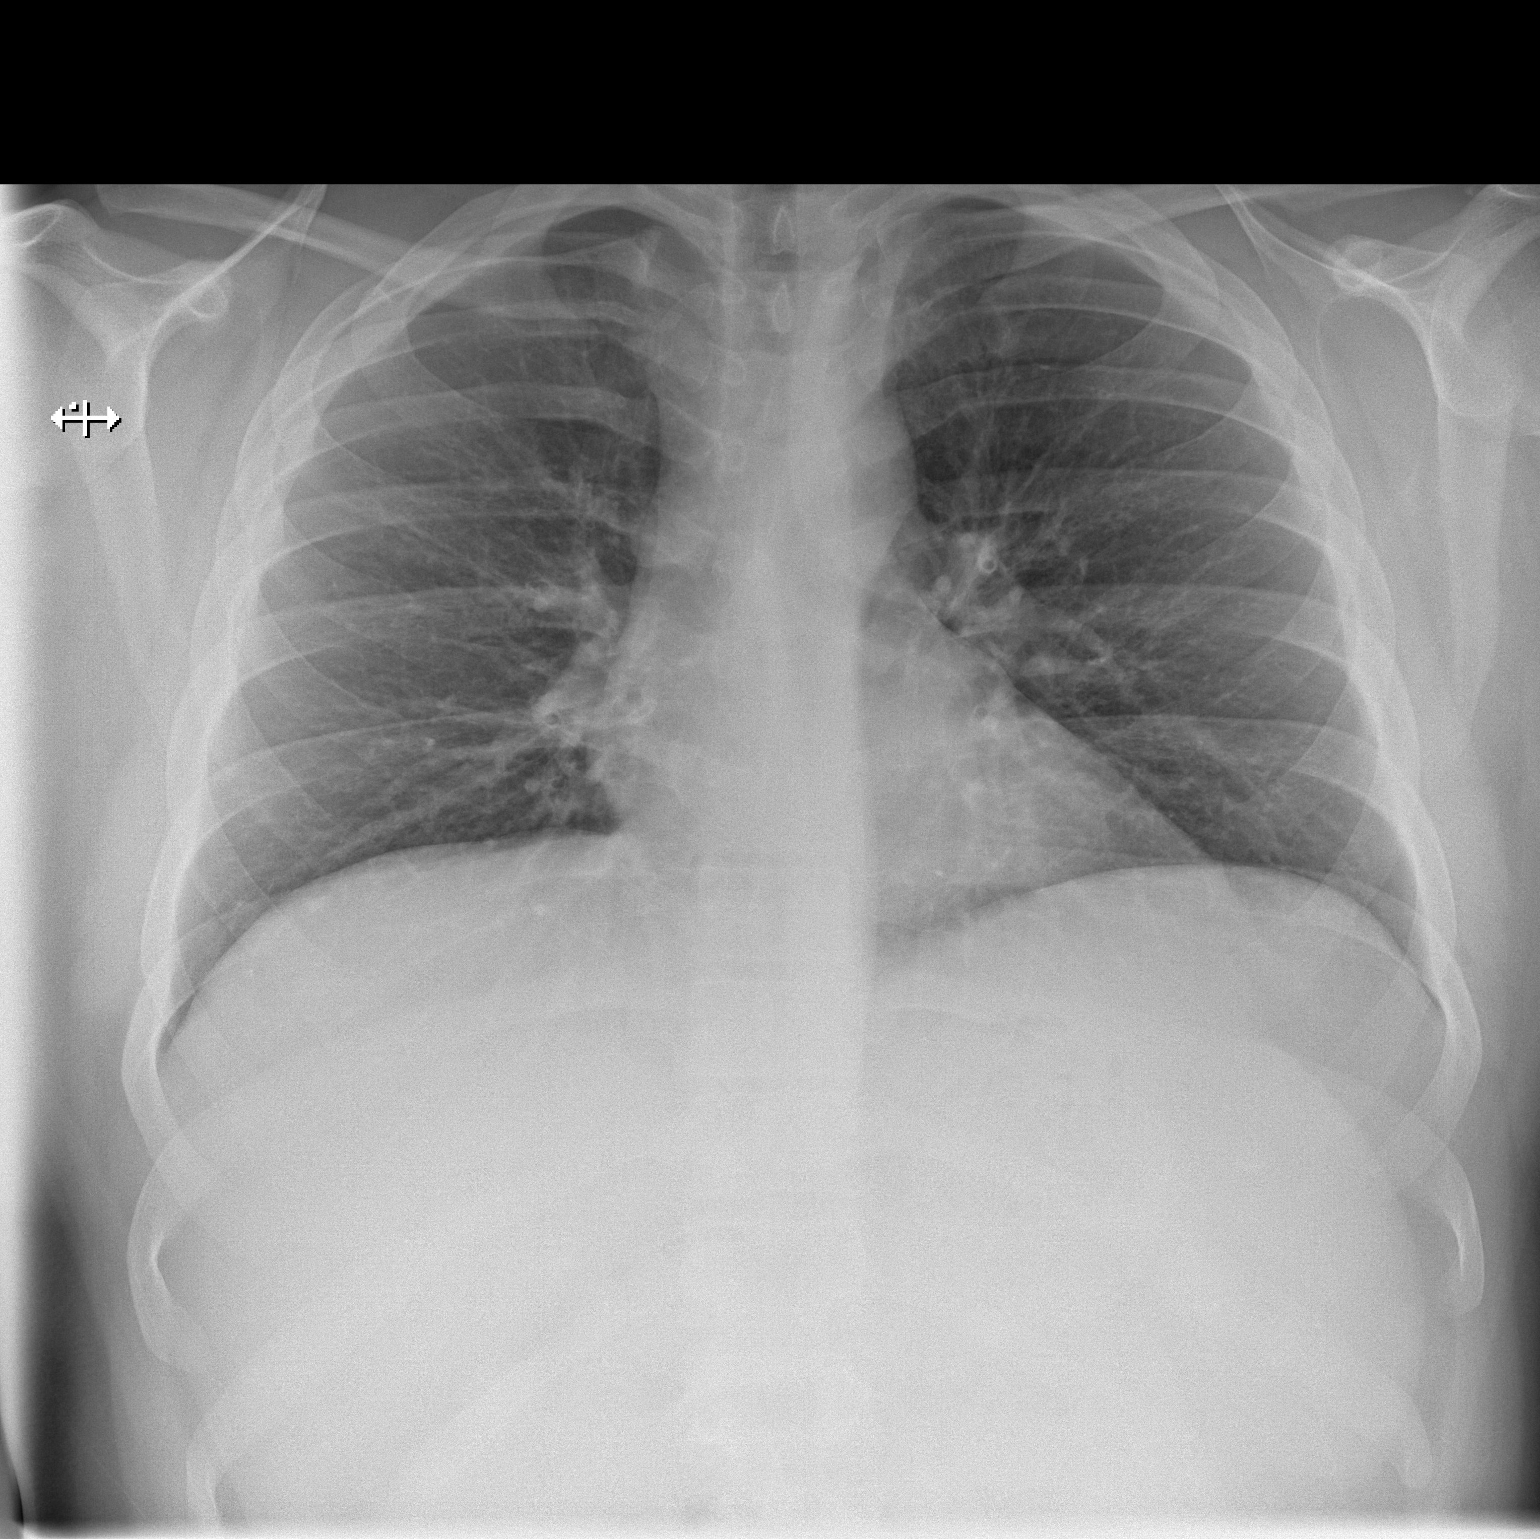

[w chest lat]
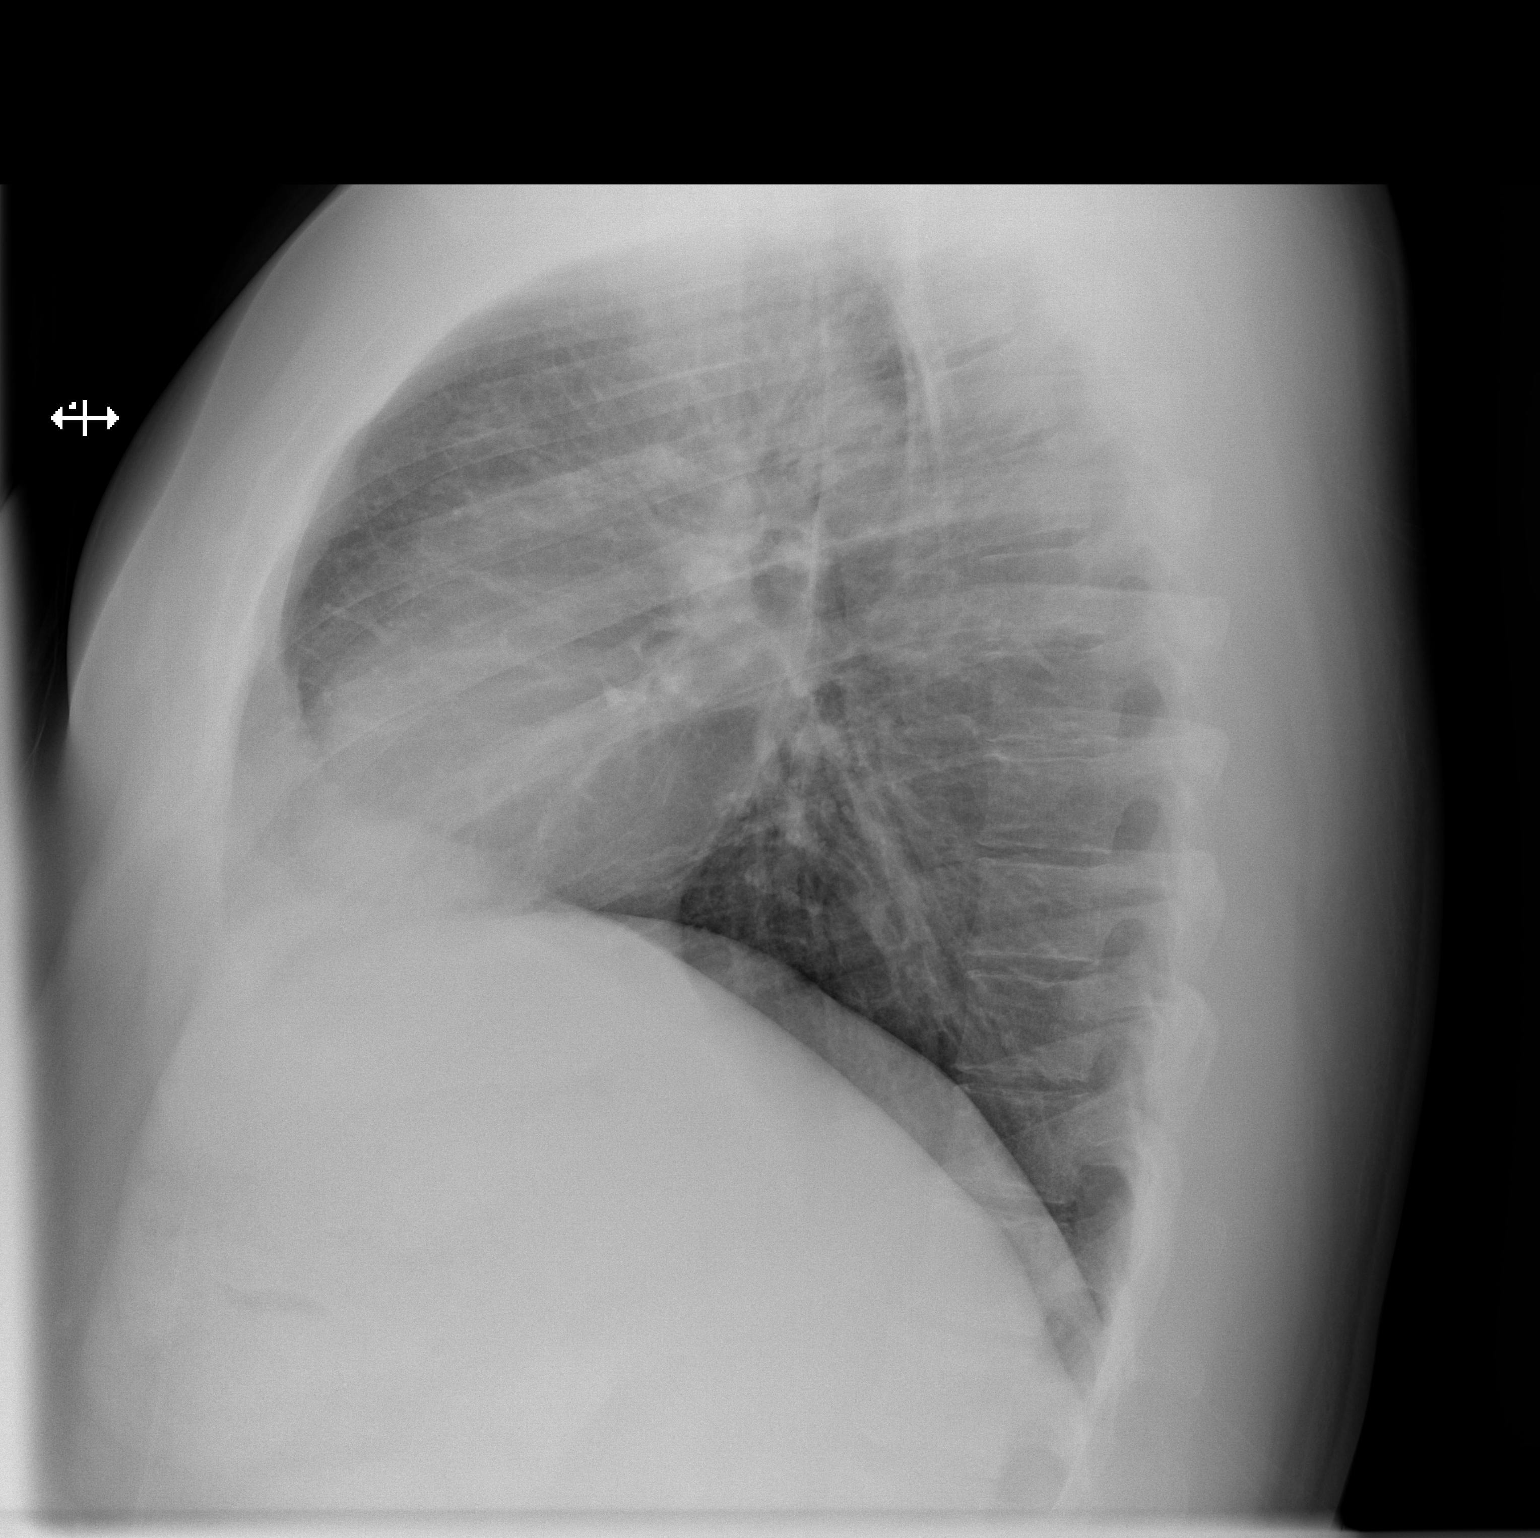

[2 of 2 positions shown; findings below may reference images not displayed]

FINDINGS: The heart size and mediastinal contours are within normal limits. No
focal consolidation. No pleural effusion. No pneumothorax. The
visualized skeletal structures are unremarkable.
IMPRESSION: No active cardiopulmonary disease.

## 2022-08-24 ENCOUNTER — Other Ambulatory Visit: Payer: Self-pay

## 2022-08-24 ENCOUNTER — Observation Stay (HOSPITAL_BASED_OUTPATIENT_CLINIC_OR_DEPARTMENT_OTHER)
Admission: EM | Admit: 2022-08-24 | Discharge: 2022-08-25 | Disposition: A | Discharge status: Home / Self Care | Payer: Self-pay | Attending: Internal Medicine | Admitting: Internal Medicine

## 2022-08-24 ENCOUNTER — Emergency Department (HOSPITAL_BASED_OUTPATIENT_CLINIC_OR_DEPARTMENT_OTHER): Payer: Self-pay

## 2022-08-24 ENCOUNTER — Encounter (HOSPITAL_COMMUNITY): Payer: Self-pay

## 2022-08-24 ENCOUNTER — Encounter (HOSPITAL_BASED_OUTPATIENT_CLINIC_OR_DEPARTMENT_OTHER): Payer: Self-pay

## 2022-08-24 DIAGNOSIS — J45901 Unspecified asthma with (acute) exacerbation: Secondary | ICD-10-CM | POA: Diagnosis present

## 2022-08-24 DIAGNOSIS — J4521 Mild intermittent asthma with (acute) exacerbation: Principal | ICD-10-CM | POA: Insufficient documentation

## 2022-08-24 DIAGNOSIS — R739 Hyperglycemia, unspecified: Secondary | ICD-10-CM | POA: Insufficient documentation

## 2022-08-24 DIAGNOSIS — E876 Hypokalemia: Secondary | ICD-10-CM | POA: Insufficient documentation

## 2022-08-24 DIAGNOSIS — E669 Obesity, unspecified: Secondary | ICD-10-CM | POA: Diagnosis present

## 2022-08-24 DIAGNOSIS — Z79899 Other long term (current) drug therapy: Secondary | ICD-10-CM | POA: Insufficient documentation

## 2022-08-24 DIAGNOSIS — J9601 Acute respiratory failure with hypoxia: Secondary | ICD-10-CM | POA: Insufficient documentation

## 2022-08-24 DIAGNOSIS — Z20822 Contact with and (suspected) exposure to covid-19: Secondary | ICD-10-CM | POA: Insufficient documentation

## 2022-08-24 DIAGNOSIS — D72829 Elevated white blood cell count, unspecified: Secondary | ICD-10-CM | POA: Insufficient documentation

## 2022-08-24 DIAGNOSIS — R000 Tachycardia, unspecified: Secondary | ICD-10-CM | POA: Diagnosis present

## 2022-08-24 DIAGNOSIS — R030 Elevated blood-pressure reading, without diagnosis of hypertension: Secondary | ICD-10-CM | POA: Diagnosis present

## 2022-08-24 LAB — CBC WITH DIFFERENTIAL/PLATELET
Abs Immature Granulocytes: 0.05 10*3/uL (ref 0.00–0.07)
Basophils Absolute: 0.1 10*3/uL (ref 0.0–0.1)
Basophils Relative: 0 %
Eosinophils Absolute: 0.4 10*3/uL (ref 0.0–0.5)
Eosinophils Relative: 2 %
HCT: 43.6 % (ref 39.0–52.0)
Hemoglobin: 15.1 g/dL (ref 13.0–17.0)
Immature Granulocytes: 0 %
Lymphocytes Relative: 12 %
Lymphs Abs: 2.2 10*3/uL (ref 0.7–4.0)
MCH: 30.1 pg (ref 26.0–34.0)
MCHC: 34.6 g/dL (ref 30.0–36.0)
MCV: 86.9 fL (ref 80.0–100.0)
Monocytes Absolute: 0.9 10*3/uL (ref 0.1–1.0)
Monocytes Relative: 5 %
Neutro Abs: 14 10*3/uL — ABNORMAL HIGH (ref 1.7–7.7)
Neutrophils Relative %: 81 %
Platelets: 356 10*3/uL (ref 150–400)
RBC: 5.02 MIL/uL (ref 4.22–5.81)
RDW: 12.3 % (ref 11.5–15.5)
WBC: 17.5 10*3/uL — ABNORMAL HIGH (ref 4.0–10.5)
nRBC: 0 % (ref 0.0–0.2)

## 2022-08-24 LAB — BASIC METABOLIC PANEL
Anion gap: 13 (ref 5–15)
BUN: 10 mg/dL (ref 6–20)
CO2: 25 mmol/L (ref 22–32)
Calcium: 9.8 mg/dL (ref 8.9–10.3)
Chloride: 104 mmol/L (ref 98–111)
Creatinine, Ser: 0.78 mg/dL (ref 0.61–1.24)
GFR, Estimated: >60 mL/min (ref >60)
Glucose, Bld: 140 mg/dL — ABNORMAL HIGH (ref 70–99)
Potassium: 3.4 mmol/L — ABNORMAL LOW (ref 3.5–5.1)
Sodium: 142 mmol/L (ref 135–145)

## 2022-08-24 LAB — RESP PANEL BY RT-PCR (FLU A&B, COVID) ARPGX2
Influenza A by PCR: NEGATIVE
Influenza B by PCR: NEGATIVE
SARS Coronavirus 2 by RT PCR: NEGATIVE

## 2022-08-24 MED ORDER — POTASSIUM CHLORIDE CRYS ER 20 MEQ PO TBCR
40.0000 meq | EXTENDED_RELEASE_TABLET | Freq: Three times a day (TID) | ORAL | Status: AC
  Administered 2022-08-24 (×2): 40 meq via ORAL
  Filled 2022-08-24 (×2): qty 2

## 2022-08-24 MED ORDER — PREDNISONE 20 MG PO TABS
40.0000 mg | ORAL_TABLET | Freq: Every day | ORAL | Status: DC
  Administered 2022-08-25: 40 mg via ORAL
  Filled 2022-08-24: qty 2

## 2022-08-24 MED ORDER — IPRATROPIUM-ALBUTEROL 0.5-2.5 (3) MG/3ML IN SOLN
3.0000 mL | Freq: Once | RESPIRATORY_TRACT | Status: AC
  Administered 2022-08-24: 3 mL via RESPIRATORY_TRACT

## 2022-08-24 MED ORDER — METHYLPREDNISOLONE SODIUM SUCC 40 MG IJ SOLR
40.0000 mg | Freq: Once | INTRAMUSCULAR | Status: AC
  Administered 2022-08-24: 40 mg via INTRAVENOUS
  Filled 2022-08-24: qty 1

## 2022-08-24 MED ORDER — ALBUTEROL SULFATE (2.5 MG/3ML) 0.083% IN NEBU
2.5000 mg | INHALATION_SOLUTION | RESPIRATORY_TRACT | Status: DC | PRN
  Administered 2022-08-24: 2.5 mg via RESPIRATORY_TRACT
  Filled 2022-08-24: qty 3

## 2022-08-24 MED ORDER — ALBUTEROL SULFATE (2.5 MG/3ML) 0.083% IN NEBU
10.0000 mg | INHALATION_SOLUTION | Freq: Once | RESPIRATORY_TRACT | Status: AC
  Administered 2022-08-24: 10 mg via RESPIRATORY_TRACT
  Filled 2022-08-24: qty 12

## 2022-08-24 MED ORDER — ALBUTEROL SULFATE HFA 108 (90 BASE) MCG/ACT IN AERS: 2.0000 | INHALATION_SPRAY | RESPIRATORY_TRACT | Status: DC | PRN

## 2022-08-24 MED ORDER — AEROCHAMBER PLUS FLO-VU MISC
1.0000 | Freq: Once | Status: AC
  Administered 2022-08-24: 1
  Filled 2022-08-24: qty 1

## 2022-08-24 MED ORDER — IPRATROPIUM-ALBUTEROL 0.5-2.5 (3) MG/3ML IN SOLN
3.0000 mL | Freq: Four times a day (QID) | RESPIRATORY_TRACT | Status: DC
Start: 2022-08-24 — End: 2022-08-25
  Administered 2022-08-24 – 2022-08-25 (×4): 3 mL via RESPIRATORY_TRACT
  Filled 2022-08-24 (×3): qty 3

## 2022-08-24 MED ORDER — SODIUM CHLORIDE 0.45 % IV BOLUS
1000.0000 mL | Freq: Once | INTRAVENOUS | Status: AC
  Administered 2022-08-24: 1000 mL via INTRAVENOUS

## 2022-08-24 MED ORDER — MAGNESIUM SULFATE 2 GM/50ML IV SOLN
2.0000 g | Freq: Once | INTRAVENOUS | Status: AC
  Administered 2022-08-24: 2 g via INTRAVENOUS
  Filled 2022-08-24: qty 50

## 2022-08-24 MED ORDER — ACETAMINOPHEN 325 MG PO TABS: 650.0000 mg | ORAL_TABLET | Freq: Four times a day (QID) | ORAL | Status: DC | PRN

## 2022-08-24 MED ORDER — ONDANSETRON HCL 4 MG PO TABS: 4.0000 mg | ORAL_TABLET | Freq: Four times a day (QID) | ORAL | Status: DC | PRN

## 2022-08-24 MED ORDER — SODIUM CHLORIDE 0.45 % IV SOLN: INTRAVENOUS | Status: AC

## 2022-08-24 MED ORDER — DILTIAZEM HCL 30 MG PO TABS: 30.0000 mg | ORAL_TABLET | Freq: Four times a day (QID) | ORAL | Status: DC | PRN

## 2022-08-24 MED ORDER — ACETAMINOPHEN 650 MG RE SUPP: 650.0000 mg | Freq: Four times a day (QID) | RECTAL | Status: DC | PRN

## 2022-08-24 MED ORDER — IPRATROPIUM-ALBUTEROL 0.5-2.5 (3) MG/3ML IN SOLN
RESPIRATORY_TRACT | Status: AC
  Administered 2022-08-24: 3 mL via RESPIRATORY_TRACT
  Filled 2022-08-24: qty 3

## 2022-08-24 MED ORDER — ALBUTEROL SULFATE (2.5 MG/3ML) 0.083% IN NEBU: 2.5000 mg | INHALATION_SOLUTION | Freq: Once | RESPIRATORY_TRACT | Status: AC

## 2022-08-24 MED ORDER — IPRATROPIUM-ALBUTEROL 0.5-2.5 (3) MG/3ML IN SOLN: 3.0000 mL | Freq: Once | RESPIRATORY_TRACT | Status: AC

## 2022-08-24 MED ORDER — ONDANSETRON HCL 4 MG/2ML IJ SOLN: 4.0000 mg | Freq: Four times a day (QID) | INTRAMUSCULAR | Status: DC | PRN

## 2022-08-24 MED ORDER — PREDNISONE 50 MG PO TABS
60.0000 mg | ORAL_TABLET | Freq: Once | ORAL | Status: AC
  Administered 2022-08-24: 60 mg via ORAL
  Filled 2022-08-24: qty 1

## 2022-08-24 MED ORDER — ALBUTEROL SULFATE (2.5 MG/3ML) 0.083% IN NEBU
2.5000 mg | INHALATION_SOLUTION | Freq: Once | RESPIRATORY_TRACT | Status: AC
  Administered 2022-08-24: 2.5 mg via RESPIRATORY_TRACT
  Filled 2022-08-24: qty 3

## 2022-08-24 MED ORDER — STERILE WATER FOR INJECTION IJ SOLN
INTRAMUSCULAR | Status: AC
  Administered 2022-08-24: 10 mL
  Filled 2022-08-24: qty 10

## 2022-08-24 MED ORDER — ENOXAPARIN SODIUM 60 MG/0.6ML IJ SOSY
60.0000 mg | PREFILLED_SYRINGE | INTRAMUSCULAR | Status: DC
  Filled 2022-08-24: qty 0.6

## 2022-08-24 MED ORDER — ALBUTEROL SULFATE (2.5 MG/3ML) 0.083% IN NEBU
INHALATION_SOLUTION | RESPIRATORY_TRACT | Status: AC
  Administered 2022-08-24: 2.5 mg via RESPIRATORY_TRACT
  Filled 2022-08-24: qty 3

## 2022-08-24 MED ORDER — ALBUTEROL SULFATE HFA 108 (90 BASE) MCG/ACT IN AERS
INHALATION_SPRAY | RESPIRATORY_TRACT | Status: AC
  Administered 2022-08-24: 2 via RESPIRATORY_TRACT
  Filled 2022-08-24: qty 6.7

## 2022-08-24 NOTE — ED Notes (Signed)
RT educated pt on smoking cessation and stopping the use of vapes. Pt verbalizes understanding of teaching at this time. Pt also educated on the proper use of MDI w/spacer. Pt able to perform without difficulty.

## 2022-08-24 NOTE — ED Provider Notes (Signed)
[facility] EMERGENCY DEPT  Provider Note  CSN: [id number] Arrival date & time: 08/24/22 0132  History Chief Complaint  Patient presents with   Asthma    John Barker is a 26 y.o. male with history of asthma has had several days of wheezing and coughing, not improved with OTC epinephrine inhaler, he does not have albuterol at home. He continues to smoke occasionally. No fevers, cough is not productive.    Home Medications Prior to Admission medications   Medication Sig Start Date End Date Taking? Authorizing Provider  albuterol (VENTOLIN HFA) 108 (90 Base) MCG/ACT inhaler Inhale 2 puffs into the lungs every 4 (four) hours as needed for wheezing or shortness of breath. 03/23/21 04/22/21  Claude Manges, PA-C  benzonatate (TESSALON) 100 MG capsule Take 1 capsule (100 mg total) by mouth every 8 (eight) hours. 05/30/17   Long, Arlyss Repress, MD  ciprofloxacin-dexamethasone (CIPRODEX) otic suspension Place 4 drops into the left ear 2 (two) times daily. Patient not taking: Reported on 05/22/2017 07/26/16   Hedges, Tinnie Gens, PA-C  methocarbamol (ROBAXIN) 500 MG tablet Take 1 tablet (500 mg total) by mouth 2 (two) times daily as needed for muscle spasms. Patient not taking: Reported on 05/22/2017 05/09/16   Everlene Farrier, PA-C  naproxen (NAPROSYN) 250 MG tablet Take 1 tablet (250 mg total) by mouth 2 (two) times daily with a meal. Patient not taking: Reported on 05/22/2017 05/09/16   Everlene Farrier, PA-C  predniSONE (DELTASONE) 10 MG tablet Take 2 tablets (20 mg total) by mouth daily. 04/12/21   Fayrene Helper, PA-C     Allergies    Bee pollen and Tomato   Review of Systems   Review of Systems Please see HPI for pertinent positives and negatives  Physical Exam BP (!) 146/73   Pulse (!) 117   Temp 98.3 F (36.8 C) (Oral)   Resp (!) 22   Ht 5\' 10"  (1.778 m)   Wt 117.9 kg   SpO2 92%   BMI 37.31 kg/m   Physical Exam Vitals and nursing note reviewed.  Constitutional:      Appearance:  Normal appearance.  HENT:     Head: Normocephalic and atraumatic.     Nose: Nose normal.     Mouth/Throat:     Mouth: Mucous membranes are moist.  Eyes:     Extraocular Movements: Extraocular movements intact.     Conjunctiva/sclera: Conjunctivae normal.  Cardiovascular:     Rate and Rhythm: Tachycardia present.  Pulmonary:     Breath sounds: Wheezing present.     Comments: tachypnea Abdominal:     General: Abdomen is flat.     Palpations: Abdomen is soft.     Tenderness: There is no abdominal tenderness.  Musculoskeletal:        General: No swelling. Normal range of motion.     Cervical back: Neck supple.  Skin:    General: Skin is warm and dry.  Neurological:     General: No focal deficit present.     Mental Status: He is alert.  Psychiatric:        Mood and Affect: Mood normal.     ED Results / Procedures / Treatments   EKG None  Procedures Procedures  Medications Ordered in the ED Medications  albuterol (VENTOLIN HFA) 108 (90 Base) MCG/ACT inhaler 2 puff (2 puffs Inhalation Given 08/24/22 0243)  ipratropium-albuterol (DUONEB) 0.5-2.5 (3) MG/3ML nebulizer solution 3 mL (3 mLs Nebulization Given 08/24/22 0151)  albuterol (PROVENTIL) (2.5 MG/3ML) 0.083% nebulizer  solution 2.5 mg (2.5 mg Nebulization Given 08/24/22 0151)  predniSONE (DELTASONE) tablet 60 mg (60 mg Oral Given 08/24/22 0202)  albuterol (PROVENTIL) (2.5 MG/3ML) 0.083% nebulizer solution 2.5 mg (2.5 mg Nebulization Given 08/24/22 0230)  ipratropium-albuterol (DUONEB) 0.5-2.5 (3) MG/3ML nebulizer solution 3 mL (3 mLs Nebulization Given 08/24/22 0242)  aerochamber plus with mask device 1 each (1 each Other Given 08/24/22 0230)  albuterol (PROVENTIL) (2.5 MG/3ML) 0.083% nebulizer solution 10 mg (10 mg Nebulization Given 08/24/22 0358)    Initial Impression and Plan  Patient here with asthma exacrebation noted to be tachycardic, tachypneic and borderline hypoxic on arrival. Will give nebs, oral steroids and reassess for  improvement.   ED Course   Clinical Course as of 08/24/22 0607  Fri Aug 24, 2022  0250 Improved air movement after neb, but still borderline hypoxic with wheezing. Will give additional nebs and reassess  [CS]  0354 Patient still wheezing with some dips in SpO2 into upper 80s when talking or sleeping. Will give a 10mg  albuterol CAT. Will add labs, Covid and CXR for possible admission if not improving.  [CS]  0438 CBC with leukocytosis, possibly from steroids given earlier this visit.  [CS]  I personally viewed the images from radiology studies and agree with radiologist interpretation: CXR is clear.   [CS]  0505 BMP is unremarkable.  [CS]  0518 Covid/Flu are negative.  [CS]  [id number] Patient continues to have wheezing and borderline hypoxia 91-93% while on 2L [place]. Will discuss admission with hospitalist.  [CS]  [phone] Spoke with Dr. [id number], Hospitalist, who will accept for admission.  [CS]    Clinical Course User Index [CS] Martyn Malay, MD     MDM Rules/Calculators/A&P Medical Decision Making Problems Addressed: Acute respiratory failure with hypoxia [facility]): acute illness or injury Mild intermittent asthma with exacerbation: chronic illness or injury with exacerbation, progression, or side effects of treatment  Amount and/or Complexity of Data Reviewed Labs: ordered. Decision-making details documented in ED Course. Radiology: ordered and independent interpretation performed. Decision-making details documented in ED Course.  Risk Prescription drug management. Decision regarding hospitalization.  Critical Care Total time providing critical care: 60 minutes    Final Clinical Impression(s) / ED Diagnoses Final diagnoses:  Mild intermittent asthma with exacerbation  Acute respiratory failure with hypoxia [facility])    Rx / DC Orders ED Discharge Orders     None        [facility], MD 08/24/22 [phone]

## 2022-08-24 NOTE — ED Notes (Signed)
Respiratory therapist at bedside.

## 2022-08-24 NOTE — H&P (Signed)
History and Physical    Patient: John Barker GLO:[id number] DOB: 05-02-1996 DOA: 08/24/2022 DOS: the patient was seen and examined on 08/24/2022 PCP: Patient, No Pcp Per  Patient coming from: Home  Chief Complaint:  Chief Complaint  Patient presents with   Asthma   HPI: John Barker is a 26 y.o. male with medical history significant of ADHD, anxiety, asthma, class II obesity who presented to the emergency department yesterday evening with a history of progressively worse dyspnea, dry cough, wheezing and fatigue for the past 3 days that did not improve with albuterol MDI.  He has a sore throat from coughing, but he denied fever, chills, rhinorrhea or hemoptysis.  No chest pain, palpitations, diaphoresis, PND, orthopnea or pitting edema of the lower extremities.  No abdominal pain, nausea, emesis, diarrhea, constipation, melena or hematochezia.  No flank pain, dysuria, frequency or hematuria.  No polyuria, polydipsia, polyphagia or blurred vision.   ED course: Initial vital signs were temperature 97.6 F, pulse 136, respirations 28/min, BP 145/91 mmHg O2 sat 91% on room air.  Patient received supplemental oxygen at 2 LPM via nasal cannula, prednisone 60 mg p.o., 2 albuterol nebs, 2 DuoNebs and a 10 mg continuous albuterol neb with partial relief of symptoms.  Lab work: CBC showed a white count 17.5, morning 15.1 g/dL platelets 188.  BMP showed a potassium 3.4 mmol/L and glucose 140 mg/dL.  Coronavirus and influenza PCR was negative.  Imaging: Chest radiograph did not show any acute cardiopulmonary pathology.   Review of Systems: As mentioned in the history of present illness. All other systems reviewed and are negative.  Past Medical History:  Diagnosis Date   ADHD (attention deficit hyperactivity disorder)    Anxiety    Asthma    Vision abnormalities    Wears glasses   Past Surgical History:  Procedure Laterality Date   NO PAST SURGERIES     THROAT SURGERY Right 2007   unknown surgery    Social History:  reports that he has never smoked. He has never used smokeless tobacco. He reports current alcohol use. He reports current drug use. Drug: Marijuana.  Allergies  Allergen Reactions   Bee Pollen Anaphylaxis   Tomato Hives    Family History  Problem Relation Age of Onset   Diabetes Mellitus II Father    Depression Maternal Grandmother    Bipolar disorder Paternal Grandmother    Heart disease Paternal Grandfather    Hypertension Paternal Grandfather     Prior to Admission medications   Medication Sig Start Date End Date Taking? Authorizing Provider  albuterol (VENTOLIN HFA) 108 (90 Base) MCG/ACT inhaler Inhale 2 puffs into the lungs every 4 (four) hours as needed for wheezing or shortness of breath. 03/23/21 04/22/21  Claude Manges, PA-C  benzonatate (TESSALON) 100 MG capsule Take 1 capsule (100 mg total) by mouth every 8 (eight) hours. 05/30/17   Long, Arlyss Repress, MD  ciprofloxacin-dexamethasone (CIPRODEX) otic suspension Place 4 drops into the left ear 2 (two) times daily. Patient not taking: Reported on 05/22/2017 07/26/16   Hedges, Tinnie Gens, PA-C  methocarbamol (ROBAXIN) 500 MG tablet Take 1 tablet (500 mg total) by mouth 2 (two) times daily as needed for muscle spasms. Patient not taking: Reported on 05/22/2017 05/09/16   Everlene Farrier, PA-C  naproxen (NAPROSYN) 250 MG tablet Take 1 tablet (250 mg total) by mouth 2 (two) times daily with a meal. Patient not taking: Reported on 05/22/2017 05/09/16   Everlene Farrier, PA-C  predniSONE (DELTASONE) 10 MG tablet  Take 2 tablets (20 mg total) by mouth daily. 04/12/21   Fayrene Helper, PA-C    Physical Exam: Vitals:   08/24/22 0434 08/24/22 0520 08/24/22 0606 08/24/22 1012  BP: 133/83 136/77 (!) 146/73 (!) 169/96  Pulse: (!) 119 (!) 134 (!) 117 (!) 120  Resp: (!) 21 20 (!) 22 (!) 24  Temp:   98.3 F (36.8 C) 98 F (36.7 C)  TempSrc:   Oral Oral  SpO2: 93% 94% 92% 97%  Weight:      Height:       Physical Exam Vitals and nursing  note reviewed.  Constitutional:      General: He is awake. He is not in acute distress.    Appearance: Normal appearance. He is obese.     Interventions: Nasal cannula in place.  HENT:     Head: Normocephalic.     Nose: No rhinorrhea.     Mouth/Throat:     Mouth: Mucous membranes are moist.  Eyes:     General: No scleral icterus.    Pupils: Pupils are equal, round, and reactive to light.  Neck:     Vascular: No JVD.  Cardiovascular:     Rate and Rhythm: Regular rhythm. Tachycardia present.     Heart sounds: S1 normal and S2 normal.  Pulmonary:     Breath sounds: Decreased air movement present. Decreased breath sounds and wheezing present. No rales.  Abdominal:     General: Bowel sounds are normal. There is no distension.     Palpations: Abdomen is soft.     Tenderness: There is no abdominal tenderness.  Musculoskeletal:     Cervical back: Neck supple.     Right lower leg: No edema.     Left lower leg: No edema.  Skin:    General: Skin is warm and dry.  Neurological:     General: No focal deficit present.     Mental Status: He is alert and oriented to person, place, and time.  Psychiatric:        Mood and Affect: Mood normal.        Behavior: Behavior normal. Behavior is cooperative.   Data Reviewed:  There are no new results to review at this time.  Assessment and Plan: Principal Problem:   Acute asthma exacerbation Observation/telemetry Continue supplemental oxygen. Scheduled and as needed bronchodilators. Methylprednisolone 40 mg IVP x1. Followed by prednisone 40 mg p.o. daily in a.m. Follow-up CBC and chemistry in the morning.   Active Problems:   Hypokalemia Secondary to albuterol use. Potassium supplementation ordered. Magnesium sulfate 2 g IVPB.    Elevated blood pressure reading Tachycardic and had 20 mg of albuterol earlier. Continue to monitor blood pressure. As needed IR diltiazem 30 mg p.o. every 6 hours.    Sinus tachycardia Secondary to  albuterol use. IV hydration. Optimize electrolytes. As needed IR diltiazem. Switch to levalbuterol if he worsens.    Hyperglycemia Received prednisone hours before measurement. Recheck fasting glucose in the morning. Further work-up depending on results.    Leukocytosis Secondary to prednisone use. Follow WBC in the morning.    Class 2 obesity Current BMI 37.31 kg/m. Will need lifestyle modifications. Needs to establish with primary care provider.    Advance Care Planning:   Code Status: Full Code   Consults:   Family Communication:   Severity of Illness: The appropriate patient status for this patient is OBSERVATION. Observation status is judged to be reasonable and necessary in order to provide the required  intensity of service to ensure the patient's safety. The patient's presenting symptoms, physical exam findings, and initial radiographic and laboratory data in the context of their medical condition is felt to place them at decreased risk for further clinical deterioration. Furthermore, it is anticipated that the patient will be medically stable for discharge from the hospital within 2 midnights of admission.   Author: Bobette Mo, MD 08/24/2022 10:43 AM  For on call review [email or web address].   This document was prepared using Dragon voice recognition software and may contain some unintended transcription errors.

## 2022-08-24 NOTE — Progress Notes (Signed)
Plan of Care Note for accepted transfer   Patient: John Barker MRN: [id number]   DOA: 08/24/2022  Facility requesting transfer: [facility] Requesting Provider: Dr. Bernette Mayers Reason for transfer: Acute Asthma Exacerbation Facility course:   26 year old male with past medical history of asthma who presented to [facility] emergency department with complaints of coughing and wheezing for the past few days.    On arrival to the emergency department patient was found to be saturating 91% on RA.  Was placed on supplemental oxygen and is now on 2lpm via [place] at 93% SpO2.  Clinically the patient was felt to be suffering from an asthma exacerbation with notable diffuse wheezing.  Chest x-ray was performed and revealed no evidence of acute infiltrate.  Patient was treated with a 1 hour continuous nebulized treatment as well as 60 mg of prednisone.  Patient continues to be tachycardic and tachypneic with diffuse wheezing and EDP is requesting hospitalization for continued treatment of acute asthma exacerbation.  COVID-19 testing negative.  Bed request placed for medical telemetry bed at [facility].    Plan of care: The patient is accepted for admission to Telemetry unit, at [facility]    Author: Marinda Elk, MD 08/24/2022  Check [email or web address] for on-call coverage.  Nursing staff, Please call [facility] Admits & Consults System-Wide number on Amion as soon as patient's arrival, so appropriate admitting provider can evaluate the pt.

## 2022-08-24 NOTE — ED Notes (Signed)
Called [facility] to advise that patient has a bed ready, [facility] 5E room 1507

## 2022-08-24 NOTE — ED Notes (Signed)
Pt placed on [place] 2 Lpm for desaturation in upper 80's. Pt BL BS insp wheezes throughout w/no distress noted at this time. RT notified MD of patient's sats at this time. MD ordered 1 hour CAT at this time. RT will continue to monitor while pt in [facility] ED.

## 2022-08-24 NOTE — ED Notes (Signed)
ED Provider at bedside and RT, Toniann Fail now at bedside.

## 2022-08-24 NOTE — ED Triage Notes (Signed)
Asthma issues for the last 3-4 days with worsening shob, chest tightness, and inability to take a full deep breath today.   Sts unable to get inhaler refills. Has been using an otc inhaler with epi.

## 2022-08-24 NOTE — ED Notes (Signed)
Report given to [facility] 

## 2022-08-24 NOTE — ED Notes (Signed)
Pt CAT completed at this time. Pt respiratory status stable on [place] 2 lpm w/no distress noted at this time. Pt remains on [place] d/t decreased sats.

## 2022-08-24 NOTE — TOC Initial Note (Signed)
Transition of Care [facility]) - Initial/Assessment Note    Patient Details  Name: John Barker MRN: [id number] Date of Birth: 10-28-1996  Transition of Care [facility]) CM/SW Contact:    Vassie Moselle, LCSW Phone Number: 08/24/2022, 12:35 PM  Clinical Narrative:                 CSW met with pt as he has no insurance and no PCP listed. Pt states he is established with PCP services with Horald Pollen at [facility]. Pt declined having PCP appointment scheduled at this time. PCP information has been added to pt's AVS for pt to schedule appointment at discharge.  Expected Discharge Plan: Home/Self Care Barriers to Discharge: No Barriers Identified   Patient Goals and CMS Choice Patient states their goals for this hospitalization and ongoing recovery are:: "To go home"   Choice offered to / list presented to : Patient  Expected Discharge Plan and Services Expected Discharge Plan: Home/Self Care In-house Referral: NA Discharge Planning Services: NA Post Acute Care Choice: NA Living arrangements for the past 2 months: Single Family Home                 DME Arranged: N/A DME Agency: NA                  Prior Living Arrangements/Services Living arrangements for the past 2 months: Single Family Home Lives with:: Self Patient language and need for interpreter reviewed:: Yes Do you feel safe going back to the place where you live?: Yes      Need for Family Participation in Patient Care: No (Comment) Care giver support system in place?: No (comment)   Criminal Activity/Legal Involvement Pertinent to Current Situation/Hospitalization: No - Comment as needed  Activities of Daily Living      Permission Sought/Granted Permission sought to share information with : PCP Permission granted to share information with : No              Emotional Assessment Appearance:: Appears stated age Attitude/Demeanor/Rapport: Engaged Affect (typically observed):  Pleasant Orientation: : Oriented to Self, Oriented to Place, Oriented to  Time, Oriented to Situation Alcohol / Substance Use: Not Applicable Psych Involvement: No (comment)  Admission diagnosis:  Mild intermittent asthma with exacerbation [J45.21] Acute respiratory failure with hypoxia ([place]) [J96.01] Acute asthma exacerbation [J45.901] Patient Active Problem List   Diagnosis Date Noted   Acute asthma exacerbation 08/24/2022   Hypokalemia 08/24/2022   Hyperglycemia 08/24/2022   Elevated blood pressure reading 08/24/2022   Leukocytosis 08/24/2022   Class 2 obesity 08/24/2022   Sinus tachycardia 08/24/2022   Depression, major, single episode, moderate ([place]) 01/10/2012   Anxiety disorder 01/10/2012   Attention-deficit hyperactivity disorder, combined type 01/10/2012   PCP:  Patient, No Pcp Per Pharmacy:   [facility] [place], [place] [address] Phone: [phone] Fax: [phone]  [place], [place] [address] [place] [address] Phone: [phone] Fax: [phone]     Social Determinants of Health (SDOH) Interventions    Readmission Risk Interventions     No data to display

## 2022-08-24 NOTE — ED Notes (Addendum)
Pt awake and alert; sitting upright at edge of bed.  Speaking at times in broken sentences.  Inspiratory and expiratory wheezing noted to upper lobes with diminished lung sounds -- Inspiratory and expiratory wheezing also to bilat posterior lower lobes.  Intermittent nonprod cough also observed while at bedside.  S1 and S2 regular with auscultation.  Will continue to monitor for acute changes and maintain plan of care.

## 2022-08-24 NOTE — ED Notes (Signed)
RT assessed pt respiratory status. Pt has history of asthma as a child. Pt not currently receiving care from pulmonologist, and states he has never had a PFT before. RT suggested he obtain a referral to pulmonologist for PFT for further evaluation of his respiratory symptoms. Pt BL BS insp/exp wheezes throughout all lung fields. RT administered 5/5 neb per protocol assessment. Post treatment pt BL BS exp wheezes w/no distress noted at this time. RT will continue to monitor while at [facility] ED.

## 2022-08-24 NOTE — ED Notes (Signed)
Pt tolerating CAT at this time. Pt respiratory status stable w/no distress noted. Pt states he still feels SOB but with some improvement. RT will continue to monitor while in [facility] ED.

## 2022-08-25 LAB — COMPREHENSIVE METABOLIC PANEL
ALT: 38 U/L (ref 0–44)
AST: 21 U/L (ref 15–41)
Albumin: 4.6 g/dL (ref 3.5–5.0)
Alkaline Phosphatase: 68 U/L (ref 38–126)
Anion gap: 10 (ref 5–15)
BUN: 12 mg/dL (ref 6–20)
CO2: 21 mmol/L — ABNORMAL LOW (ref 22–32)
Calcium: 9.3 mg/dL (ref 8.9–10.3)
Chloride: 109 mmol/L (ref 98–111)
Creatinine, Ser: 0.72 mg/dL (ref 0.61–1.24)
GFR, Estimated: >60 mL/min (ref >60)
Glucose, Bld: 100 mg/dL — ABNORMAL HIGH (ref 70–99)
Potassium: 3.7 mmol/L (ref 3.5–5.1)
Sodium: 140 mmol/L (ref 135–145)
Total Bilirubin: 0.5 mg/dL (ref 0.3–1.2)
Total Protein: 8 g/dL (ref 6.5–8.1)

## 2022-08-25 LAB — CBC
HCT: 45.8 % (ref 39.0–52.0)
Hemoglobin: 15.2 g/dL (ref 13.0–17.0)
MCH: 29.7 pg (ref 26.0–34.0)
MCHC: 33.2 g/dL (ref 30.0–36.0)
MCV: 89.6 fL (ref 80.0–100.0)
Platelets: 407 10*3/uL — ABNORMAL HIGH (ref 150–400)
RBC: 5.11 MIL/uL (ref 4.22–5.81)
RDW: 12.6 % (ref 11.5–15.5)
WBC: 17.4 10*3/uL — ABNORMAL HIGH (ref 4.0–10.5)
nRBC: 0 % (ref 0.0–0.2)

## 2022-08-25 MED ORDER — IPRATROPIUM-ALBUTEROL 0.5-2.5 (3) MG/3ML IN SOLN: 3.0000 mL | Freq: Four times a day (QID) | RESPIRATORY_TRACT | 5 refills | Status: AC | PRN

## 2022-08-25 MED ORDER — PREDNISONE 20 MG PO TABS
40.0000 mg | ORAL_TABLET | Freq: Every day | ORAL | 0 refills | Status: AC
Start: 2022-08-26 — End: 2022-08-31

## 2022-08-25 NOTE — Final Progress Note (Signed)
Pt discharged home.  AVS and medications reviewed with pt, no questions or concerns upon departure.  Transported by father via private vehicle.  All belongings sent with pt.  IV removed without complication.

## 2022-08-25 NOTE — Discharge Summary (Signed)
Physician Discharge Summary   Patient: John Barker MRN: [id number] DOB: October 05, 1996  Admit date:     08/24/2022  Discharge date: 08/25/22  Discharge Physician: Kathlen Mody   PCP: Patient, No Pcp Per   Recommendations at discharge:  Please follow up with PCP in one week.  Please check cbc and bmp IN ONE WEEK.   Discharge Diagnoses: Principal Problem:   Acute asthma exacerbation Active Problems:   Hypokalemia   Hyperglycemia   Elevated blood pressure reading   Leukocytosis   Class 2 obesity   Sinus tachycardia    Hospital Course: Jacobo Moncrief is a 26 y.o. male with medical history significant of ADHD, anxiety, asthma, class II obesity who presented to the emergency department yesterday evening with a history of progressively worse dyspnea, dry cough, wheezing and fatigue for the past 3 days that did not improve with albuterol MDI.  Coronavirus and influenza PCR was negative.   Imaging: Chest radiograph did not show any acute cardiopulmonary pathology.  Assessment and Plan:     Acute asthma exacerbation Resolved.  Transitioned to oral steroids on discharge.  Discharged on bronchodilators prn.    Hypokalemia Replaced.    Elevated blood pressure reading BP parameters are optimal this am.     Sinus tachycardia Secondary to albuterol use.      Hyperglycemia Received prednisone hours before measurement. Recommend checking cbc      Leukocytosis Secondary to prednisone use. Recommend checking cbc in one week.      Class 2 obesity Current BMI 37.31 kg/m. Will need lifestyle modifications. Needs to establish with primary care provider.         Consultants: none.  Procedures performed: none  Disposition: Home Diet recommendation:  Regular diet DISCHARGE MEDICATION: Allergies as of 08/25/2022       Reactions   Bee Pollen Anaphylaxis, Swelling   Onion Other (See Comments)   Discoloration of skin    Tomato Hives, Other (See Comments)   Discoloration of  skin         Medication List     STOP taking these medications    ciprofloxacin-dexamethasone OTIC suspension Commonly known as: Ciprodex   naproxen 250 MG tablet Commonly known as: Naprosyn       TAKE these medications    aspirin 325 MG tablet Take 325 mg by mouth as needed for headache.   BRONKAID PO Take 1 tablet by mouth 2 (two) times daily as needed (shortness of breath).   fluticasone 50 MCG/ACT nasal spray Commonly known as: FLONASE Place 1 spray into both nostrils as needed for allergies or rhinitis.   ipratropium-albuterol 0.5-2.5 (3) MG/3ML Soln Commonly known as: DUONEB Take 3 mLs by nebulization every 6 (six) hours as needed.   MUCINEX DM PO Take 1 tablet by mouth daily as needed (sickness).   predniSONE 20 MG tablet Commonly known as: DELTASONE Take 2 tablets (40 mg total) by mouth daily with breakfast for 5 days. Start taking on: August 26, 2022 What changed:  medication strength how much to take when to take this               Durable Medical Equipment  (From admission, onward)           Start     Ordered   08/25/22 0000  For home use only DME Nebulizer machine       Question Answer Comment  Patient needs a nebulizer to treat with the following condition Acute asthma exacerbation   Length of  Need Lifetime      08/25/22 1119            Follow-up Information     Dois Davenport, MD Follow up.   Specialty: Family Medicine Why: Please call to schedule an appointment with you primary care provider Contact information: [address] [place] [address] [place] [address] [phone]                Discharge Exam: Ceasar Mons Weights   08/24/22 0144  Weight: 117.9 kg   General exam: Appears calm and comfortable  Respiratory system: diminished air entry at bases, no wheezing heard.  Cardiovascular system: S1 & S2 heard, RRR. No JVD, murmurs, rubs, gallops or clicks. No pedal edema. Gastrointestinal system: Abdomen  is nondistended, soft and nontender. No organomegaly or masses felt. Normal bowel sounds heard. Central nervous system: Alert and oriented. No focal neurological deficits. Extremities: Symmetric 5 x 5 power. Skin: No rashes, lesions or ulcers Psychiatry: Judgement and insight appear normal. Mood & affect appropriate.    Condition at discharge: good  The results of significant diagnostics from this hospitalization (including imaging, microbiology, ancillary and laboratory) are listed below for reference.   Imaging Studies: DG Chest Port 1 View  Result Date: 08/24/2022 CLINICAL DATA:  Shortness of breath with asthma.  Hypoxia EXAM: PORTABLE CHEST 1 VIEW COMPARISON:  04/12/2021 FINDINGS: Normal heart size and mediastinal contours. No acute infiltrate or edema. No effusion or pneumothorax. No acute osseous findings. IMPRESSION: Stable chest.  No evidence of active disease. Electronically Signed   By: Tiburcio Pea M.D.   On: 08/24/2022 04:43    Microbiology: Results for orders placed or performed during the hospital encounter of 08/24/22  Resp Panel by RT-PCR (Flu A&B, Covid) Anterior Nasal Swab     Status: None   Collection Time: 08/24/22  4:10 AM   Specimen: Anterior Nasal Swab  Result Value Ref Range Status   SARS Coronavirus 2 by RT PCR NEGATIVE NEGATIVE Final    Comment: (NOTE) SARS-CoV-2 target nucleic acids are NOT DETECTED.  The SARS-CoV-2 RNA is generally detectable in upper respiratory specimens during the acute phase of infection. The lowest concentration of SARS-CoV-2 viral copies this assay can detect is 138 copies/mL. A negative result does not preclude SARS-Cov-2 infection and should not be used as the sole basis for treatment or other patient management decisions. A negative result may occur with  improper specimen collection/handling, submission of specimen other than nasopharyngeal swab, presence of viral mutation(s) within the areas targeted by this assay, and  inadequate number of viral copies(<138 copies/mL). A negative result must be combined with clinical observations, patient history, and epidemiological information. The expected result is Negative.  Fact Sheet for Patients:  BloggerCourse.com  Fact Sheet for Healthcare Providers:  SeriousBroker.it  This test is no t yet approved or cleared by the [place] FDA and  has been authorized for detection and/or diagnosis of SARS-CoV-2 by FDA under an Emergency Use Authorization (EUA). This EUA will remain  in effect (meaning this test can be used) for the duration of the COVID-19 declaration under Section 564(b)(1) of the Act, 21 U.S.C.section 360bbb-3(b)(1), unless the authorization is terminated  or revoked sooner.       Influenza A by PCR NEGATIVE NEGATIVE Final   Influenza B by PCR NEGATIVE NEGATIVE Final    Comment: (NOTE) The Xpert Xpress SARS-CoV-2/FLU/RSV plus assay is intended as an aid in the diagnosis of influenza from Nasopharyngeal swab specimens and should not be  used as a sole basis for treatment. Nasal washings and aspirates are unacceptable for Xpert Xpress SARS-CoV-2/FLU/RSV testing.  Fact Sheet for Patients: BloggerCourse.com  Fact Sheet for Healthcare Providers: SeriousBroker.it  This test is not yet approved or cleared by the [place] FDA and has been authorized for detection and/or diagnosis of SARS-CoV-2 by FDA under an Emergency Use Authorization (EUA). This EUA will remain in effect (meaning this test can be used) for the duration of the COVID-19 declaration under Section 564(b)(1) of the Act, 21 U.S.C. section 360bbb-3(b)(1), unless the authorization is terminated or revoked.  Performed at [facility], [address] [place], [place] [address]     Labs: CBC: Recent Labs  Lab 08/24/22 0410 08/25/22 0635  WBC 17.5*  17.4*  NEUTROABS 14.0*  --   HGB 15.1 15.2  HCT 43.6 45.8  MCV 86.9 89.6  PLT 356 407*   Basic Metabolic Panel: Recent Labs  Lab 08/24/22 0410 08/25/22 0635  NA 142 140  K 3.4* 3.7  CL 104 109  CO2 25 21*  GLUCOSE 140* 100*  BUN 10 12  CREATININE 0.78 0.72  CALCIUM 9.8 9.3   Liver Function Tests: Recent Labs  Lab 08/25/22 0635  AST 21  ALT 38  ALKPHOS 68  BILITOT 0.5  PROT 8.0  ALBUMIN 4.6   CBG: No results for input(s): "GLUCAP" in the last 168 hours.  Discharge time spent: 40  minutes.   Signed: Kathlen Mody, MD [facility] 08/25/2022

## 2022-08-26 LAB — HIV ANTIBODY (ROUTINE TESTING W REFLEX): HIV Screen 4th Generation wRfx: NONREACTIVE
